# Patient Record
Sex: Female | Born: 1980 | Hispanic: No | Marital: Married | Smoking: Never smoker
Health system: Southern US, Community
[De-identification: ages and names within clinical notes are randomized; demographics above are authoritative.]

## PROBLEM LIST (undated history)

## (undated) ENCOUNTER — Inpatient Hospital Stay (HOSPITAL_COMMUNITY): Payer: Self-pay

## (undated) DIAGNOSIS — M549 Dorsalgia, unspecified: Secondary | ICD-10-CM

## (undated) DIAGNOSIS — Z789 Other specified health status: Secondary | ICD-10-CM

## (undated) HISTORY — PX: APPENDECTOMY: SHX54

---

## 2016-03-08 ENCOUNTER — Emergency Department (HOSPITAL_COMMUNITY)
Admission: EM | Admit: 2016-03-08 | Discharge: 2016-03-08 | Disposition: A | Discharge status: Home / Self Care | Payer: Self-pay | Attending: Emergency Medicine | Admitting: Emergency Medicine

## 2016-03-08 ENCOUNTER — Encounter (HOSPITAL_COMMUNITY): Payer: Self-pay

## 2016-03-08 DIAGNOSIS — G8929 Other chronic pain: Secondary | ICD-10-CM | POA: Insufficient documentation

## 2016-03-08 DIAGNOSIS — M5441 Lumbago with sciatica, right side: Secondary | ICD-10-CM | POA: Insufficient documentation

## 2016-03-08 DIAGNOSIS — M542 Cervicalgia: Secondary | ICD-10-CM | POA: Insufficient documentation

## 2016-03-08 DIAGNOSIS — M5442 Lumbago with sciatica, left side: Secondary | ICD-10-CM | POA: Insufficient documentation

## 2016-03-08 MED ORDER — CYCLOBENZAPRINE HCL 10 MG PO TABS: 10.0000 mg | ORAL_TABLET | Freq: Two times a day (BID) | ORAL | 0 refills | Status: DC | PRN

## 2016-03-08 NOTE — ED Provider Notes (Signed)
[facility]-EMERGENCY DEPT Provider Note   CSN: [id number] Arrival date & time: 03/08/16  1735  By signing my name below, I, Diona Browner and Doreatha Martin, attest that this documentation has been prepared under the direction and in the presence of Roxy Horseman, PA-C.  Electronically Signed: Diona Browner and Doreatha Martin, ED Scribe. 03/08/16. 6:29 PM.  History   Chief Complaint Chief Complaint  Patient presents with  . Back Pain  . Neck Pain    HPI Elizabeth Brennan is a 36 y.o. female who presents to the Emergency Department complaining of moderate, gradually worsening lower back pain with radiation to the right thigh for the last two days. Pt reports associated bilateral ankle pain, posterior neck pain. Pt reports no recent injuries or travel outside the country. Pt notes movement exacerbates her pain and reports she has been taking Ibuprofen with some relief of pain. Pt is ambulatory without difficulty. Of note, when pt was living in [place] she was told she had a disc problem, but did not receive treatment. Pt denies rash, dysuria and any other associated symptoms at this time.   The history is provided by the patient. A language interpreter was used (Arabic).    No past medical history on file.  There are no active problems to display for this patient.   No past surgical history on file.  OB History    No data available       Home Medications    Prior to Admission medications   Not on File    Family History No family history on file.  Social History Social History  Substance Use Topics  . Smoking status: Never Smoker  . Smokeless tobacco: Never Used  . Alcohol use No     Allergies   Patient has no allergy information on record.   Review of Systems Review of Systems  Constitutional: Positive for fever (subjective).  Genitourinary: Negative for dysuria.  Musculoskeletal: Positive for back pain, myalgias and neck pain.  Skin: Negative for rash.  Neurological:  Positive for headaches.     Physical Exam Updated Vital Signs BP 118/76 (BP Location: Left Arm)   Pulse 80   Temp 98.2 F (36.8 C) (Oral)   Resp 16   Ht 5\' 6"  (1.676 m)   Wt 170 lb (77.1 kg)   SpO2 100%   BMI 27.44 kg/m   Physical Exam Physical Exam  Constitutional: Pt appears well-developed and well-nourished. No distress.  HENT:  Head: Normocephalic and atraumatic.  Mouth/Throat: Oropharynx is clear and moist. No oropharyngeal exudate.  Eyes: Conjunctivae are normal.  Neck: Normal range of motion. Neck supple.  No meningismus Cardiovascular: Normal rate, regular rhythm and intact distal pulses.   Pulmonary/Chest: Effort normal and breath sounds normal. No respiratory distress. Pt has no wheezes.  Abdominal: Pt exhibits no distension Musculoskeletal:  Cervical and lumbar paraspinal muscles tender to palpation, no bony CTLS spine tenderness, deformity, step-off, or crepitus Lymphadenopathy: Pt has no cervical adenopathy.  Neurological: Pt is alert and oriented Speech is clear and goal oriented, follows commands Normal 5/5 strength in upper and lower extremities bilaterally including dorsiflexion and plantar flexion, strong and equal grip strength Sensation intact Great toe extension intact Moves extremities without ataxia, coordination intact  Normal gait Normal balance No Clonus Skin: Skin is warm and dry. No rash noted. Pt is not diaphoretic. No erythema.  Psychiatric: Pt has a normal mood and affect. Behavior is normal.  Nursing note and vitals reviewed.   ED Treatments /  Results  DIAGNOSTIC STUDIES: Oxygen Saturation is 100% on RA, normal by my interpretation.   COORDINATION OF CARE: 7:02 PM-Discussed next steps with pt which includes being prescribed a muscle relaxer, using ice and heat for relief, and continue use of ibuprofen. Pt verbalized understanding and is agreeable with the plan.   Labs (all labs ordered are listed, but only abnormal results are  displayed) Labs Reviewed - No data to display  EKG  EKG Interpretation None       Radiology No results found.  Procedures Procedures (including critical care time)  Medications Ordered in ED Medications - No data to display   Initial Impression / Assessment and Plan / ED Course  I have reviewed the triage vital signs and the nursing notes.  Pertinent labs & imaging results that were available during my care of the patient were reviewed by me and considered in my medical decision making (see chart for details).     Patient with back pain.  No neurological deficits and normal neuro exam.  Patient is ambulatory.  No loss of bowel or bladder control.  Doubt cauda equina.  Denies fever,  doubt epidural abscess or other lesion. Recommend back exercises, stretching, RICE, and will treat with a short course of flexeril. Continue NSAIDs.  Encouraged the patient that there could be a need for additional workup and/or imaging such as MRI, if the symptoms do not resolve. Patient advised that if the back pain does not resolve, or radiates, this could progress to more serious conditions and is encouraged to follow-up with PCP or orthopedics within 2 weeks.     Final Clinical Impressions(s) / ED Diagnoses   Final diagnoses:  Chronic midline low back pain with bilateral sciatica  Neck pain    New Prescriptions Discharge Medication List as of 03/08/2016  7:05 PM    START taking these medications   Details  cyclobenzaprine (FLEXERIL) 10 MG tablet Take 1 tablet (10 mg total) by mouth 2 (two) times daily as needed for muscle spasms., Starting Sat 03/08/2016, Print       I personally performed the services described in this documentation, which was scribed in my presence. The recorded information has been reviewed and is accurate.       Roxy HorsemanRobert Landa Mullinax, PA-C 03/08/16 2017    Laurence Spatesachel Morgan Little, MD 03/10/16 [phone]

## 2016-03-08 NOTE — ED Triage Notes (Signed)
Pt complaining of low back pain and neck pain. Pt denies any injury/trauma. Pt ambulatory at triage.

## 2016-08-11 ENCOUNTER — Encounter (HOSPITAL_COMMUNITY): Payer: Self-pay | Admitting: Emergency Medicine

## 2016-08-11 ENCOUNTER — Emergency Department (HOSPITAL_COMMUNITY): Payer: Self-pay

## 2016-08-11 DIAGNOSIS — L03011 Cellulitis of right finger: Secondary | ICD-10-CM | POA: Insufficient documentation

## 2016-08-11 NOTE — ED Triage Notes (Signed)
Triage completed w/ the use of the stratus interpretor as the pt and visitor speaks Arabic.  Pt's middle finger on her right hand is swollen and has an abscess what is yellow in color.  Pt does not remember injuring the finger but states the pain began 10 days ago. She has used Ibprofen for pain control however that is not working anymore.

## 2016-08-12 ENCOUNTER — Emergency Department (HOSPITAL_COMMUNITY)
Admission: EM | Admit: 2016-08-12 | Discharge: 2016-08-12 | Disposition: A | Discharge status: Home / Self Care | Payer: Self-pay | Attending: Emergency Medicine | Admitting: Emergency Medicine

## 2016-08-12 DIAGNOSIS — L03011 Cellulitis of right finger: Secondary | ICD-10-CM

## 2016-08-12 MED ORDER — DOXYCYCLINE HYCLATE 100 MG PO CAPS: 100.0000 mg | ORAL_CAPSULE | Freq: Two times a day (BID) | ORAL | 0 refills | Status: DC

## 2016-08-12 MED ORDER — LIDOCAINE HCL (PF) 1 % IJ SOLN
5.0000 mL | Freq: Once | INTRAMUSCULAR | Status: AC
  Administered 2016-08-12: 5 mL
  Filled 2016-08-12: qty 5

## 2016-08-12 NOTE — ED Notes (Signed)
Patient is A&Ox4.  No signs of distress noted.  Please see providers complete history and physical exam.  

## 2016-08-12 NOTE — ED Provider Notes (Signed)
[facility] Provider Note   CSN: [id number] Arrival date & time: 08/11/16  2123     History   Chief Complaint Chief Complaint  Patient presents with  . Hand Pain  . Abscess    HPI Elizabeth Brennan is a 36 y.o. female.  Patient presents to the ED with a chief complaint of finger infection.  Patient states that she noticed the infection about 10 days ago.  She denies any trauma or other injury.  Denies biting her nails.  Denies any fever or chills.  She has tried taking ibuprofen with no relief.  The symptoms are worsened with palpation and movement.   The history is provided by the patient. The history is limited by a language barrier. A language interpreter was used.    History reviewed. No pertinent past medical history.  There are no active problems to display for this patient.   History reviewed. No pertinent surgical history.  OB History    No data available       Home Medications    Prior to Admission medications   Medication Sig Start Date End Date Taking? Authorizing Provider  cyclobenzaprine (FLEXERIL) 10 MG tablet Take 1 tablet (10 mg total) by mouth 2 (two) times daily as needed for muscle spasms. 03/08/16   Roxy HorsemanBrowning, Euline Kimbler, PA-C    Family History No family history on file.  Social History Social History  Substance Use Topics  . Smoking status: Never Smoker  . Smokeless tobacco: Never Used  . Alcohol use No     Allergies   Patient has no known allergies.   Review of Systems Review of Systems  All other systems reviewed and are negative.    Physical Exam Updated Vital Signs BP 105/77 (BP Location: Right Arm)   Pulse 73   Temp 98.2 F (36.8 C) (Oral)   Resp 18   SpO2 99%   Physical Exam  Constitutional: She is oriented to person, place, and time. She appears well-developed and well-nourished.  HENT:  Head: Normocephalic and atraumatic.  Eyes: Conjunctivae and EOM are normal.  Neck: Normal range of motion.  Cardiovascular:  Normal rate.   Pulmonary/Chest: Effort normal.  Abdominal: She exhibits no distension.  Musculoskeletal: Normal range of motion.  Neurological: She is alert and oriented to person, place, and time.  Skin: Skin is dry.  Large dorsal paronychia extending from the cuticle to the DIP of the right middle finger, no palmar involvement, no sign of felon  Psychiatric: She has a normal mood and affect. Her behavior is normal. Judgment and thought content normal.  Nursing note and vitals reviewed.    ED Treatments / Results  Labs (all labs ordered are listed, but only abnormal results are displayed) Labs Reviewed - No data to display  EKG  EKG Interpretation None       Radiology Dg Finger Middle Right  Result Date: 08/11/2016 CLINICAL DATA:  36 year old female with infection and swelling of the distal aspect of the middle finger. EXAM: RIGHT MIDDLE FINGER 2+V COMPARISON:  None. FINDINGS: There is no acute fracture or dislocation. The bones are well mineralized. No arthritic changes. There is no periosteal elevation or erosive changes. There is diffuse soft tissue swelling of the third digit primarily involving the distal aspect of the finger. No radiopaque foreign object or soft tissue gas. IMPRESSION: 1. No acute osseous pathology. 2. Diffuse soft tissue swelling of the third digit. Electronically Signed   By: Elgie CollardArash  Radparvar M.D.   On: 08/11/2016 22:19  Procedures Procedures (including critical care time) INCISION AND DRAINAGE Performed by: Roxy HorsemanBROWNING, Layla Kesling Consent: Verbal consent obtained. Risks and benefits: risks, benefits and alternatives were discussed Type: abscess  Body area: Right middle finger  Anesthesia: local infiltration  Incision was made with a scalpel.  Local anesthetic: lidocaine 1% without epinephrine  Anesthetic total: 3 ml  Complexity: complex Blunt dissection to break up loculations  Drainage: purulent  Drainage amount: copious  Packing material:  1/4 in iodoform gauze  Patient tolerance: Patient tolerated the procedure well with no immediate complications.    Medications Ordered in ED Medications  lidocaine (PF) (XYLOCAINE) 1 % injection 5 mL (not administered)     Initial Impression / Assessment and Plan / ED Course  I have reviewed the triage vital signs and the nursing notes.  Pertinent labs & imaging results that were available during my care of the patient were reviewed by me and considered in my medical decision making (see chart for details).     Patient with skin abscess amenable to incision and drainage.  Abscess was not large enough to warrant packing or drain,  wound recheck in 2 days. Encouraged home warm soaks and flushing.  Mild signs of cellulitis is surrounding skin.  Will d/c to home.     Final Clinical Impressions(s) / ED Diagnoses   Final diagnoses:  None    New Prescriptions New Prescriptions   DOXYCYCLINE (VIBRAMYCIN) 100 MG CAPSULE    Take 1 capsule (100 mg total) by mouth 2 (two) times daily.     Roxy HorsemanBrowning, Onofrio Klemp, PA-C 08/12/16 0330    Palumbo, April, MD 08/12/16 [phone]

## 2016-11-15 ENCOUNTER — Emergency Department (HOSPITAL_COMMUNITY): Payer: Self-pay

## 2016-11-15 ENCOUNTER — Emergency Department (HOSPITAL_COMMUNITY)
Admission: EM | Admit: 2016-11-15 | Discharge: 2016-11-15 | Discharge status: Against Medical Advice | Payer: Self-pay | Attending: Emergency Medicine | Admitting: Emergency Medicine

## 2016-11-15 ENCOUNTER — Encounter (HOSPITAL_COMMUNITY): Payer: Self-pay

## 2016-11-15 DIAGNOSIS — R509 Fever, unspecified: Secondary | ICD-10-CM | POA: Insufficient documentation

## 2016-11-15 DIAGNOSIS — R059 Cough, unspecified: Secondary | ICD-10-CM

## 2016-11-15 DIAGNOSIS — R300 Dysuria: Secondary | ICD-10-CM | POA: Insufficient documentation

## 2016-11-15 DIAGNOSIS — M542 Cervicalgia: Secondary | ICD-10-CM | POA: Insufficient documentation

## 2016-11-15 DIAGNOSIS — M5442 Lumbago with sciatica, left side: Secondary | ICD-10-CM | POA: Insufficient documentation

## 2016-11-15 DIAGNOSIS — Z79899 Other long term (current) drug therapy: Secondary | ICD-10-CM | POA: Insufficient documentation

## 2016-11-15 DIAGNOSIS — J189 Pneumonia, unspecified organism: Secondary | ICD-10-CM

## 2016-11-15 DIAGNOSIS — J181 Lobar pneumonia, unspecified organism: Secondary | ICD-10-CM | POA: Insufficient documentation

## 2016-11-15 DIAGNOSIS — R05 Cough: Secondary | ICD-10-CM

## 2016-11-15 DIAGNOSIS — R51 Headache: Secondary | ICD-10-CM | POA: Insufficient documentation

## 2016-11-15 HISTORY — DX: Dorsalgia, unspecified: M54.9

## 2016-11-15 LAB — COMPREHENSIVE METABOLIC PANEL
ALBUMIN: 3.7 g/dL (ref 3.5–5.0)
ALT: 11 U/L — ABNORMAL LOW (ref 14–54)
AST: 23 U/L (ref 15–41)
Alkaline Phosphatase: 63 U/L (ref 38–126)
Anion gap: 8 (ref 5–15)
BUN: 9 mg/dL (ref 6–20)
CHLORIDE: 105 mmol/L (ref 101–111)
CO2: 20 mmol/L — AB (ref 22–32)
Calcium: 8.8 mg/dL — ABNORMAL LOW (ref 8.9–10.3)
Creatinine, Ser: 0.68 mg/dL (ref 0.44–1.00)
GFR calc Af Amer: >60 mL/min (ref >60)
GFR calc non Af Amer: >60 mL/min (ref >60)
GLUCOSE: 95 mg/dL (ref 65–99)
POTASSIUM: 3.4 mmol/L — AB (ref 3.5–5.1)
Sodium: 133 mmol/L — ABNORMAL LOW (ref 135–145)
Total Bilirubin: 0.4 mg/dL (ref 0.3–1.2)
Total Protein: 8 g/dL (ref 6.5–8.1)

## 2016-11-15 LAB — URINALYSIS, ROUTINE W REFLEX MICROSCOPIC
BILIRUBIN URINE: NEGATIVE
Glucose, UA: NEGATIVE mg/dL
Ketones, ur: 20 mg/dL — AB
Nitrite: NEGATIVE
PROTEIN: 30 mg/dL — AB
SPECIFIC GRAVITY, URINE: 1.031 — AB (ref 1.005–1.030)
pH: 5 (ref 5.0–8.0)

## 2016-11-15 LAB — CBC
HEMATOCRIT: 39.2 % (ref 36.0–46.0)
Hemoglobin: 13.3 g/dL (ref 12.0–15.0)
MCH: 28.2 pg (ref 26.0–34.0)
MCHC: 33.9 g/dL (ref 30.0–36.0)
MCV: 83.1 fL (ref 78.0–100.0)
PLATELETS: 172 10*3/uL (ref 150–400)
RBC: 4.72 MIL/uL (ref 3.87–5.11)
RDW: 13.7 % (ref 11.5–15.5)
WBC: 4.8 10*3/uL (ref 4.0–10.5)

## 2016-11-15 LAB — I-STAT BETA HCG BLOOD, ED (MC, WL, AP ONLY): I-stat hCG, quantitative: <5 m[IU]/mL (ref <5)

## 2016-11-15 MED ORDER — LEVOFLOXACIN 750 MG PO TABS: 750.0000 mg | ORAL_TABLET | Freq: Every day | ORAL | 0 refills | Status: AC

## 2016-11-15 MED ORDER — CYCLOBENZAPRINE HCL 10 MG PO TABS: 10.0000 mg | ORAL_TABLET | Freq: Two times a day (BID) | ORAL | 0 refills | Status: DC | PRN

## 2016-11-15 MED ORDER — IBUPROFEN 600 MG PO TABS: 600.0000 mg | ORAL_TABLET | Freq: Four times a day (QID) | ORAL | 0 refills | Status: DC | PRN

## 2016-11-15 MED ORDER — KETOROLAC TROMETHAMINE 30 MG/ML IJ SOLN
30.0000 mg | Freq: Once | INTRAMUSCULAR | Status: AC
  Administered 2016-11-15: 30 mg via INTRAVENOUS
  Filled 2016-11-15: qty 1

## 2016-11-15 MED ORDER — LEVOFLOXACIN 750 MG PO TABS
750.0000 mg | ORAL_TABLET | Freq: Once | ORAL | Status: AC
  Administered 2016-11-15: 750 mg via ORAL
  Filled 2016-11-15: qty 1

## 2016-11-15 NOTE — ED Notes (Signed)
No answer for triage X3 

## 2016-11-15 NOTE — ED Notes (Signed)
Lab reports there was not enough urine sent down, offered urine cup to pt again, pt unable to void at this time.

## 2016-11-15 NOTE — Discharge Instructions (Signed)
Take full course of antibiotics as prescribed.  Follow-up with the [facility] next week to ensure you are improving.  If you develop worsening shortness of breath, chest pain, or persistent fevers and chills despite these antibiotics please return to the emergency department for reevaluation.  Health department should follow-up with you regarding tuberculosis screening for your family, please continue to wear a mask until you hear from the health department.  Your back pain can be treated with ibuprofen and Flexeril.  If the symptoms are not improving with these medications and rest please follow-up with primary doctor at [facility] for further evaluation.  If you develop difficulty walking, lower extremity weakness or numbness, or unable to control your bowel or bladder please return to the emergency department for evaluation.

## 2016-11-15 NOTE — ED Triage Notes (Signed)
Pt arrived with c/o cough, back pain, and headache. Pt rpeorts her son who is with her has been treated for TB since July. Pt reports nonproductive cough and states she has felt like she has had a fever X3 days. Afebrile upon arrival.

## 2016-11-15 NOTE — ED Provider Notes (Signed)
[facility] EMERGENCY DEPARTMENT Provider Note   CSN: [id number] Arrival date & time: 11/15/16  1126     History   Chief Complaint Chief Complaint  Patient presents with  . Cough    HPI  Elizabeth Brennan is a 36 y.o. Female with a history of low back pain, presents with multiple complaints.  Patient reports nonproductive cough, which started yesterday, No hemoptysis.  Patient reports her 24-year-old sonwas diagnosed with TB in July and is currently receiving treatment, but she and the rest of the people in her household are not receiving any treatment for tuberculosis or exposure.  Reports feeling feverish with some chills, did not take her temperature at home.  Patient denies any associated rhinorrhea, congestion, ear pain, or shortness of breath.  Patient reports her husband has recently had a cough, she thinks she is been diagnosed with the flu.    Also here complaining of low back pain, more so on the left side, she reports she has had this back pain in the past.  She reports back pain is constant soreness, with pain that radiates down the left leg.  Patient reports she has had similar symptoms in the past and taken medication for it, but is unable to remember what medication. Denies weakness, numbness, loss of bowel/bladder function or saddle anesthesia.  She also reports some left-sided neck pain, this does not radiate anywhere, patient reports it just feels sore especially when she moves her neck.  She reports associated headache, that is generalized and feels like a tight band around her head, she has had similar headaches in the past that typically resolve on their own.  She denies any nausea vomiting or photophobia, no vision changes, no weakness, numbness or tingling of extremities.  Pt is arabic speaking, stratus interpreting services used to obtain history. Pt is a poor historian and is unable to provide much detail about her symptoms.      Past Medical History:    Diagnosis Date  . Back pain     There are no active problems to display for this patient.   Past Surgical History:  Procedure Laterality Date  . APPENDECTOMY      OB History    No data available       Home Medications    Prior to Admission medications   Medication Sig Start Date End Date Taking? Authorizing Provider  cyclobenzaprine (FLEXERIL) 10 MG tablet Take 1 tablet (10 mg total) by mouth 2 (two) times daily as needed for muscle spasms. 03/08/16   Roxy Horseman, PA-C  doxycycline (VIBRAMYCIN) 100 MG capsule Take 1 capsule (100 mg total) by mouth 2 (two) times daily. 08/12/16   Roxy Horseman, PA-C    Family History No family history on file.  Social History Social History  Substance Use Topics  . Smoking status: Never Smoker  . Smokeless tobacco: Never Used  . Alcohol use No     Allergies   Patient has no known allergies.   Review of Systems Review of Systems  Constitutional: Positive for chills and fever.  HENT: Negative for congestion, ear pain, rhinorrhea and sore throat.   Eyes: Negative for photophobia, pain and visual disturbance.  Respiratory: Positive for cough. Negative for chest tightness and shortness of breath.   Cardiovascular: Negative for chest pain, palpitations and leg swelling.  Gastrointestinal: Negative for abdominal pain, constipation, diarrhea, nausea and vomiting.  Genitourinary: Positive for dysuria. Negative for flank pain, frequency and hematuria.  Musculoskeletal: Positive for back  pain and neck pain. Negative for neck stiffness.  Skin: Negative for color change, pallor and rash.  Neurological: Positive for headaches. Negative for dizziness, syncope, weakness, light-headedness and numbness.     Physical Exam Updated Vital Signs BP 116/85   Pulse 91   Temp 98.6 F (37 C) (Oral)   Resp 16   Ht 5\' 9"  (1.753 m)   Wt 72.6 kg (160 lb)   LMP 10/18/2016   SpO2 97%   BMI 23.63 kg/m   Physical Exam  Constitutional: She  appears well-developed and well-nourished. No distress.  HENT:  Head: Normocephalic and atraumatic.  Eyes: Pupils are equal, round, and reactive to light. EOM are normal. Right eye exhibits no discharge. Left eye exhibits no discharge.  Neck: Normal range of motion. Neck supple.  No midline C-spine tenderness, mild paraspinal tenderness on the left side  Cardiovascular: Normal rate, regular rhythm, normal heart sounds and intact distal pulses.   Pulmonary/Chest: Effort normal and breath sounds normal. No respiratory distress. She has no wheezes. She has no rales.  Abdominal: Soft. Bowel sounds are normal. She exhibits no distension and no mass. There is no tenderness. There is no guarding.  Musculoskeletal: She exhibits no edema or deformity.  L-spine NTTP at midline,tenderness along left lower back, no ecchymosis or rash noted. L-spine ROM minimally limited by pain. Normal ROM of LE. Bilateral distal pulses 2+  Neurological: She is alert. Coordination normal.  Speech is clear, able to follow commands CN III-XII intact Normal strength in upper and lower extremities bilaterally including dorsiflexion and plantar flexion, strong and equal grip strength Sensation normal to light and sharp touch Moves extremities without ataxia, coordination intact Normal finger to nose and rapid alternating movements No pronator drift  Skin: Skin is warm and dry. Capillary refill takes less than 2 seconds. She is not diaphoretic.  Psychiatric: She has a normal mood and affect. Her behavior is normal.  Nursing note and vitals reviewed.    ED Treatments / Results  Labs (all labs ordered are listed, but only abnormal results are displayed) Labs Reviewed  URINALYSIS, ROUTINE W REFLEX MICROSCOPIC - Abnormal; Notable for the following:       Result Value   Color, Urine AMBER (*)    APPearance CLOUDY (*)    Specific Gravity, Urine 1.031 (*)    Hgb urine dipstick LARGE (*)    Ketones, ur 20 (*)    Protein,  ur 30 (*)    Leukocytes, UA TRACE (*)    Bacteria, UA MANY (*)    Squamous Epithelial / LPF 6-30 (*)    All other components within normal limits  COMPREHENSIVE METABOLIC PANEL - Abnormal; Notable for the following:    Sodium 133 (*)    Potassium 3.4 (*)    CO2 20 (*)    Calcium 8.8 (*)    ALT 11 (*)    All other components within normal limits  CBC  HIV ANTIBODY (ROUTINE TESTING)  QUANTIFERON-TB GOLD PLUS  I-STAT BETA HCG BLOOD, ED ([facility], [facility], [facility] ONLY)    EKG  EKG Interpretation None       Radiology Dg Chest Port 1 View  Result Date: 11/15/2016 CLINICAL DATA:  Cough.  Clinical concern for tuberculosis. EXAM: PORTABLE CHEST 1 VIEW COMPARISON:  None. FINDINGS: Normal sized heart. Minimal patchy and linear density at the right lung base. Otherwise, clear lungs. Normal appearing bones. IMPRESSION: Minimal patchy and linear density at the right lung base. This has an appearance suspicious  for a combination of pneumonia and linear atelectasis. No findings concerning for tuberculosis infection. Electronically Signed   By: Beckie Salts M.D.   On: 11/15/2016 13:21    Procedures Procedures (including critical care time)  Medications Ordered in ED Medications  ketorolac (TORADOL) 30 MG/ML injection 30 mg (30 mg Intravenous Given 11/15/16 1716)  levofloxacin (LEVAQUIN) tablet 750 mg (750 mg Oral Given 11/15/16 1716)     Initial Impression / Assessment and Plan / ED Course  I have reviewed the triage vital signs and the nursing notes.  Pertinent labs & imaging results that were available during my care of the patient were reviewed by me and considered in my medical decision making (see chart for details).  Presents with 2 days of cough and subjective fever, in the setting of potential TB exposure from  her 37-year-old son who is currently on treatment.  Patient also complaining of low back pain, with some neck pain and headache.  Back pain presentation suggestive of left sided sciatica.  Denies weakness, numbness, loss of bowel/bladder function or saddle anesthesia, no concern for cauda equina. HA presentation is like pts typical HA and non concerning for [facility], ICH, Meningitis, or temporal arteritis. Pt is afebrile with no focal neuro deficits, nuchal rigidity, or change in vision.   Chest x-ray shows right lower lobe infiltrate, radiologist reports CXR non-concerning for TB. Labs without leukocytosis, mild hypokalemia encouraged pt to eat food high in potassium. Kidney and liver function WNL. UA not a clean catch, unlikely infection, but ABX for pneumonia will cover regardless. Pt given Toradol for back pain and received first dose of abx in ED.  Consulted infectious disease regarding chest x-ray, spoke with Dr. Drue Second who recommends treating for CAP, as well as collecting QuantiFERON and HIV labs on the patient.  Infectious disease MD says they will reach out to the health department and have them follow-up with the family.  Pt will be discharged with Levaquin for CAP as well as ibuprofen and flexeril for back pain. Pt to follow up with [facility] next week. Health department to follow up with pt regarding further TB screening, pt to continue to wear mask until then. Return precautions discussed.  Patient discussed with Dr. Rush Landmark, who agrees with plan.   Final Clinical Impressions(s) / ED Diagnoses   Final diagnoses:  Community acquired pneumonia of right lower lobe of lung (HCC)  Left-sided low back pain with left-sided sciatica, unspecified chronicity    New Prescriptions Discharge Medication List as of 11/15/2016  4:56 PM    START taking these medications   Details  !! cyclobenzaprine (FLEXERIL) 10 MG tablet Take 1 tablet (10 mg total) by mouth 2 (two) times daily as needed for muscle spasms., Starting Sat 11/15/2016, Print    ibuprofen (ADVIL,MOTRIN) 600 MG tablet Take 1 tablet (600 mg total) by mouth every 6 (six) hours as needed.,  Starting Sat 11/15/2016, Print    levofloxacin (LEVAQUIN) 750 MG tablet Take 1 tablet (750 mg total) by mouth daily. X 5 days, Starting Sat 11/15/2016, Until Thu 11/20/2016, Print     !! - Potential duplicate medications found. Please discuss with provider.       Dartha Lodge, PA-C 11/15/16 2021    Tegeler, Canary Brim, MD 11/16/16 [phone]

## 2016-11-15 NOTE — ED Notes (Signed)
No answer for triage.

## 2016-11-16 LAB — HIV ANTIBODY (ROUTINE TESTING W REFLEX): HIV Screen 4th Generation wRfx: NONREACTIVE

## 2016-11-18 LAB — QUANTIFERON-TB GOLD PLUS

## 2017-07-06 ENCOUNTER — Other Ambulatory Visit: Payer: Self-pay

## 2017-07-06 ENCOUNTER — Encounter (HOSPITAL_COMMUNITY): Payer: Self-pay | Admitting: *Deleted

## 2017-07-06 ENCOUNTER — Inpatient Hospital Stay (HOSPITAL_COMMUNITY)
Admission: AD | Admit: 2017-07-06 | Discharge: 2017-07-06 | Disposition: A | Discharge status: Home / Self Care | Payer: Self-pay | Source: Ambulatory Visit | Attending: Obstetrics and Gynecology | Admitting: Obstetrics and Gynecology

## 2017-07-06 DIAGNOSIS — R109 Unspecified abdominal pain: Secondary | ICD-10-CM | POA: Insufficient documentation

## 2017-07-06 LAB — URINALYSIS, ROUTINE W REFLEX MICROSCOPIC
Bilirubin Urine: NEGATIVE
GLUCOSE, UA: NEGATIVE mg/dL
Ketones, ur: NEGATIVE mg/dL
Nitrite: NEGATIVE
PH: 5 (ref 5.0–8.0)
Protein, ur: NEGATIVE mg/dL
Specific Gravity, Urine: 1.026 (ref 1.005–1.030)

## 2017-07-06 NOTE — MAU Note (Signed)
Abd pain.  ? Allergy, rash all over body, show pin dots spots on arms, on spot is ~1cm. Present for 3 wks.  Itches.  Today she is tired, has vomited many many times.  [facility]

## 2017-07-23 LAB — OB RESULTS CONSOLE ANTIBODY SCREEN: ANTIBODY SCREEN: NEGATIVE

## 2017-07-23 LAB — OB RESULTS CONSOLE GC/CHLAMYDIA
CHLAMYDIA, DNA PROBE: NEGATIVE
Gonorrhea: NEGATIVE

## 2017-07-23 LAB — OB RESULTS CONSOLE ABO/RH: RH Type: POSITIVE

## 2017-07-23 LAB — OB RESULTS CONSOLE RPR: RPR: NONREACTIVE

## 2017-07-23 LAB — OB RESULTS CONSOLE HIV ANTIBODY (ROUTINE TESTING): HIV: NONREACTIVE

## 2017-07-23 LAB — OB RESULTS CONSOLE RUBELLA ANTIBODY, IGM: Rubella: IMMUNE

## 2017-07-23 LAB — OB RESULTS CONSOLE HEPATITIS B SURFACE ANTIGEN: HEP B S AG: NEGATIVE

## 2017-07-29 ENCOUNTER — Encounter (HOSPITAL_COMMUNITY): Payer: Self-pay

## 2017-07-31 ENCOUNTER — Ambulatory Visit (HOSPITAL_COMMUNITY)
Admission: RE | Admit: 2017-07-31 | Discharge: 2017-07-31 | Disposition: A | Discharge status: Home / Self Care | Payer: Self-pay | Source: Ambulatory Visit | Attending: Nurse Practitioner | Admitting: Nurse Practitioner

## 2017-07-31 ENCOUNTER — Encounter (HOSPITAL_COMMUNITY): Payer: Self-pay

## 2017-07-31 HISTORY — DX: Other specified health status: Z78.9

## 2018-01-08 LAB — OB RESULTS CONSOLE GBS: GBS: NEGATIVE

## 2018-02-01 ENCOUNTER — Other Ambulatory Visit: Payer: Self-pay | Admitting: Family Medicine

## 2018-02-01 ENCOUNTER — Encounter (HOSPITAL_COMMUNITY): Payer: Self-pay | Admitting: *Deleted

## 2018-02-01 ENCOUNTER — Telehealth (HOSPITAL_COMMUNITY): Payer: Self-pay | Admitting: *Deleted

## 2018-02-01 NOTE — Telephone Encounter (Signed)
[phone] interpreter number  Preadmission screen

## 2018-02-02 ENCOUNTER — Inpatient Hospital Stay (HOSPITAL_COMMUNITY): Payer: Medicaid Other | Admitting: Anesthesiology

## 2018-02-02 ENCOUNTER — Encounter (HOSPITAL_COMMUNITY)
Admission: AD | Disposition: A | Discharge status: Home / Self Care | Payer: Self-pay | Source: Home / Self Care | Attending: Family Medicine

## 2018-02-02 ENCOUNTER — Inpatient Hospital Stay (HOSPITAL_COMMUNITY)
Admission: AD | Admit: 2018-02-02 | Discharge: 2018-02-05 | DRG: 787 | Disposition: A | Discharge status: Home / Self Care | Payer: Medicaid Other | Attending: Family Medicine | Admitting: Family Medicine

## 2018-02-02 ENCOUNTER — Other Ambulatory Visit: Payer: Self-pay

## 2018-02-02 ENCOUNTER — Encounter (HOSPITAL_COMMUNITY): Payer: Self-pay | Admitting: *Deleted

## 2018-02-02 DIAGNOSIS — Z98891 History of uterine scar from previous surgery: Secondary | ICD-10-CM

## 2018-02-02 DIAGNOSIS — O324XX0 Maternal care for high head at term, not applicable or unspecified: Secondary | ICD-10-CM | POA: Diagnosis present

## 2018-02-02 DIAGNOSIS — Z3A40 40 weeks gestation of pregnancy: Secondary | ICD-10-CM

## 2018-02-02 DIAGNOSIS — D62 Acute posthemorrhagic anemia: Secondary | ICD-10-CM | POA: Diagnosis not present

## 2018-02-02 DIAGNOSIS — O480 Post-term pregnancy: Secondary | ICD-10-CM | POA: Diagnosis present

## 2018-02-02 DIAGNOSIS — O9089 Other complications of the puerperium, not elsewhere classified: Secondary | ICD-10-CM | POA: Diagnosis not present

## 2018-02-02 DIAGNOSIS — O9081 Anemia of the puerperium: Secondary | ICD-10-CM | POA: Diagnosis not present

## 2018-02-02 DIAGNOSIS — M5126 Other intervertebral disc displacement, lumbar region: Secondary | ICD-10-CM | POA: Diagnosis present

## 2018-02-02 DIAGNOSIS — R531 Weakness: Secondary | ICD-10-CM | POA: Diagnosis not present

## 2018-02-02 DIAGNOSIS — R200 Anesthesia of skin: Secondary | ICD-10-CM | POA: Diagnosis not present

## 2018-02-02 LAB — CBC
HCT: 35.7 % — ABNORMAL LOW (ref 36.0–46.0)
Hemoglobin: 11.3 g/dL — ABNORMAL LOW (ref 12.0–15.0)
MCH: 25.2 pg — ABNORMAL LOW (ref 26.0–34.0)
MCHC: 31.7 g/dL (ref 30.0–36.0)
MCV: 79.7 fL — ABNORMAL LOW (ref 80.0–100.0)
NRBC: 0 % (ref 0.0–0.2)
Platelets: 128 10*3/uL — ABNORMAL LOW (ref 150–400)
RBC: 4.48 MIL/uL (ref 3.87–5.11)
RDW: 17 % — ABNORMAL HIGH (ref 11.5–15.5)
WBC: 7.3 10*3/uL (ref 4.0–10.5)

## 2018-02-02 LAB — ABO/RH: ABO/RH(D): B POS

## 2018-02-02 SURGERY — Surgical Case
Anesthesia: Epidural

## 2018-02-02 MED ORDER — TERBUTALINE SULFATE 1 MG/ML IJ SOLN: 0.2500 mg | Freq: Once | INTRAMUSCULAR | Status: DC | PRN

## 2018-02-02 MED ORDER — LACTATED RINGERS IV SOLN
INTRAVENOUS | Status: DC | PRN
  Administered 2018-02-02 – 2018-02-03 (×3): via INTRAVENOUS

## 2018-02-02 MED ORDER — SODIUM CHLORIDE 0.9 % IV SOLN
500.0000 mg | Freq: Once | INTRAVENOUS | Status: AC
  Administered 2018-02-03: 500 mg via INTRAVENOUS
  Filled 2018-02-02: qty 500

## 2018-02-02 MED ORDER — LACTATED RINGERS IV SOLN: 500.0000 mL | Freq: Once | INTRAVENOUS | Status: DC

## 2018-02-02 MED ORDER — FENTANYL 2.5 MCG/ML BUPIVACAINE 1/10 % EPIDURAL INFUSION (WH - ANES)
14.0000 mL/h | INTRAMUSCULAR | Status: DC | PRN
  Administered 2018-02-02 (×2): 14 mL/h via EPIDURAL
  Filled 2018-02-02 (×3): qty 100

## 2018-02-02 MED ORDER — LIDOCAINE HCL (PF) 1 % IJ SOLN
INTRAMUSCULAR | Status: DC | PRN
  Administered 2018-02-02 (×2): 4 mL via EPIDURAL

## 2018-02-02 MED ORDER — SODIUM BICARBONATE 8.4 % IV SOLN
INTRAVENOUS | Status: DC | PRN
  Administered 2018-02-02: 5 mL via EPIDURAL
  Administered 2018-02-02: 3 mL via EPIDURAL
  Administered 2018-02-02: 10 mL via EPIDURAL
  Administered 2018-02-03: 2 mL via EPIDURAL
  Administered 2018-02-03 (×2): 3 mL via EPIDURAL

## 2018-02-02 MED ORDER — ONDANSETRON HCL 4 MG/2ML IJ SOLN
INTRAMUSCULAR | Status: AC
  Filled 2018-02-02: qty 2

## 2018-02-02 MED ORDER — SODIUM BICARBONATE 8.4 % IV SOLN
INTRAVENOUS | Status: AC
  Filled 2018-02-02: qty 50

## 2018-02-02 MED ORDER — CEFAZOLIN SODIUM-DEXTROSE 2-4 GM/100ML-% IV SOLN
2.0000 g | Freq: Once | INTRAVENOUS | Status: AC
  Administered 2018-02-02: 2 g via INTRAVENOUS

## 2018-02-02 MED ORDER — OXYTOCIN 40 UNITS IN NORMAL SALINE INFUSION - SIMPLE MED
1.0000 m[IU]/min | INTRAVENOUS | Status: DC
  Administered 2018-02-02: 2 m[IU]/min via INTRAVENOUS
  Filled 2018-02-02: qty 1000

## 2018-02-02 MED ORDER — ONDANSETRON HCL 4 MG/2ML IJ SOLN
4.0000 mg | Freq: Four times a day (QID) | INTRAMUSCULAR | Status: DC | PRN
  Administered 2018-02-03: 4 mg via INTRAVENOUS

## 2018-02-02 MED ORDER — MORPHINE SULFATE (PF) 0.5 MG/ML IJ SOLN
INTRAMUSCULAR | Status: AC
  Filled 2018-02-02: qty 10

## 2018-02-02 MED ORDER — PHENYLEPHRINE 40 MCG/ML (10ML) SYRINGE FOR IV PUSH (FOR BLOOD PRESSURE SUPPORT)
80.0000 ug | PREFILLED_SYRINGE | INTRAVENOUS | Status: DC | PRN
  Filled 2018-02-02: qty 10

## 2018-02-02 MED ORDER — LIDOCAINE-EPINEPHRINE (PF) 2 %-1:200000 IJ SOLN
INTRAMUSCULAR | Status: AC
  Filled 2018-02-02: qty 20

## 2018-02-02 MED ORDER — LIDOCAINE HCL (PF) 1 % IJ SOLN
30.0000 mL | INTRAMUSCULAR | Status: DC | PRN
  Filled 2018-02-02: qty 30

## 2018-02-02 MED ORDER — OXYTOCIN 40 UNITS IN NORMAL SALINE INFUSION - SIMPLE MED
2.5000 [IU]/h | INTRAVENOUS | Status: DC
  Filled 2018-02-02: qty 1000

## 2018-02-02 MED ORDER — LACTATED RINGERS IV SOLN
INTRAVENOUS | Status: DC
  Administered 2018-02-02 (×3): via INTRAVENOUS

## 2018-02-02 MED ORDER — OXYTOCIN BOLUS FROM INFUSION: 500.0000 mL | Freq: Once | INTRAVENOUS | Status: DC

## 2018-02-02 MED ORDER — LACTATED RINGERS IV SOLN
500.0000 mL | INTRAVENOUS | Status: DC | PRN
  Administered 2018-02-02: 500 mL via INTRAVENOUS
  Administered 2018-02-02: 1000 mL via INTRAVENOUS

## 2018-02-02 MED ORDER — DIPHENHYDRAMINE HCL 50 MG/ML IJ SOLN: 12.5000 mg | INTRAMUSCULAR | Status: DC | PRN

## 2018-02-02 MED ORDER — FENTANYL CITRATE (PF) 100 MCG/2ML IJ SOLN: 100.0000 ug | INTRAMUSCULAR | Status: DC | PRN

## 2018-02-02 MED ORDER — SOD CITRATE-CITRIC ACID 500-334 MG/5ML PO SOLN
30.0000 mL | ORAL | Status: DC | PRN
  Filled 2018-02-02: qty 15

## 2018-02-02 MED ORDER — EPHEDRINE 5 MG/ML INJ: 10.0000 mg | INTRAVENOUS | Status: DC | PRN

## 2018-02-02 MED ORDER — FLEET ENEMA 7-19 GM/118ML RE ENEM: 1.0000 | ENEMA | RECTAL | Status: DC | PRN

## 2018-02-02 MED ORDER — MISOPROSTOL 25 MCG QUARTER TABLET: 25.0000 ug | ORAL_TABLET | ORAL | Status: DC | PRN

## 2018-02-02 MED ORDER — LACTATED RINGERS AMNIOINFUSION
INTRAVENOUS | Status: DC
  Administered 2018-02-02: 21:00:00 via INTRAUTERINE

## 2018-02-02 MED ORDER — PHENYLEPHRINE 40 MCG/ML (10ML) SYRINGE FOR IV PUSH (FOR BLOOD PRESSURE SUPPORT): 80.0000 ug | PREFILLED_SYRINGE | INTRAVENOUS | Status: DC | PRN

## 2018-02-02 MED ORDER — OXYTOCIN 10 UNIT/ML IJ SOLN
INTRAMUSCULAR | Status: AC
  Filled 2018-02-02: qty 4

## 2018-02-02 SURGICAL SUPPLY — 35 items
BENZOIN TINCTURE PRP APPL 2/3 (GAUZE/BANDAGES/DRESSINGS) ×3 IMPLANT
CHLORAPREP W/TINT 26ML (MISCELLANEOUS) ×3 IMPLANT
CLAMP CORD UMBIL (MISCELLANEOUS) IMPLANT
CLOSURE WOUND 1/2 X4 (GAUZE/BANDAGES/DRESSINGS) ×1
CLOTH BEACON ORANGE TIMEOUT ST (SAFETY) ×3 IMPLANT
DRSG OPSITE POSTOP 4X10 (GAUZE/BANDAGES/DRESSINGS) ×3 IMPLANT
ELECT REM PT RETURN 9FT ADLT (ELECTROSURGICAL) ×3
ELECTRODE REM PT RTRN 9FT ADLT (ELECTROSURGICAL) ×1 IMPLANT
EXTRACTOR VACUUM M CUP 4 TUBE (SUCTIONS) IMPLANT
EXTRACTOR VACUUM M CUP 4' TUBE (SUCTIONS)
GLOVE BIOGEL PI IND STRL 7.0 (GLOVE) ×2 IMPLANT
GLOVE BIOGEL PI IND STRL 7.5 (GLOVE) ×2 IMPLANT
GLOVE BIOGEL PI INDICATOR 7.0 (GLOVE) ×4
GLOVE BIOGEL PI INDICATOR 7.5 (GLOVE) ×4
GLOVE ECLIPSE 7.5 STRL STRAW (GLOVE) ×3 IMPLANT
GOWN STRL REUS W/TWL LRG LVL3 (GOWN DISPOSABLE) ×9 IMPLANT
KIT ABG SYR 3ML LUER SLIP (SYRINGE) IMPLANT
NEEDLE HYPO 25X5/8 SAFETYGLIDE (NEEDLE) IMPLANT
NS IRRIG 1000ML POUR BTL (IV SOLUTION) ×3 IMPLANT
PACK C SECTION WH (CUSTOM PROCEDURE TRAY) ×3 IMPLANT
PAD OB MATERNITY 4.3X12.25 (PERSONAL CARE ITEMS) ×3 IMPLANT
PENCIL SMOKE EVAC W/HOLSTER (ELECTROSURGICAL) ×3 IMPLANT
RTRCTR C-SECT PINK 25CM LRG (MISCELLANEOUS) ×3 IMPLANT
SPONGE LAP 18X18 RF (DISPOSABLE) ×9 IMPLANT
STRIP CLOSURE SKIN 1/2X4 (GAUZE/BANDAGES/DRESSINGS) ×2 IMPLANT
SUT PLAIN 2 0 (SUTURE) ×2
SUT PLAIN ABS 2-0 CT1 27XMFL (SUTURE) ×1 IMPLANT
SUT VIC AB 0 CTX 36 (SUTURE) ×10
SUT VIC AB 0 CTX36XBRD ANBCTRL (SUTURE) ×5 IMPLANT
SUT VIC AB 2-0 CT1 27 (SUTURE) ×2
SUT VIC AB 2-0 CT1 TAPERPNT 27 (SUTURE) ×1 IMPLANT
SUT VIC AB 4-0 KS 27 (SUTURE) ×3 IMPLANT
TOWEL OR 17X24 6PK STRL BLUE (TOWEL DISPOSABLE) ×3 IMPLANT
TRAY FOLEY W/BAG SLVR 14FR LF (SET/KITS/TRAYS/PACK) ×3 IMPLANT
WATER STERILE IRR 1000ML POUR (IV SOLUTION) ×3 IMPLANT

## 2018-02-02 NOTE — H&P (Signed)
LABOR AND DELIVERY ADMISSION HISTORY AND PHYSICAL NOTE  Elizabeth Brennan is a 38 y.o. female [id number] with IUP at [redacted]w[redacted]d by LMP presenting for SOL.  Contractions started last night at 5pm. Increasing frequency and pain.   She reports positive fetal movement. She denies leakage of fluid or vaginal bleeding.  Prenatal History/Complications: PNC at HD Pregnancy complications:  - AMA - Grandmultiparity  Past Medical History: Past Medical History:  Diagnosis Date  . Back pain   . Medical history non-contributory     Past Surgical History: Past Surgical History:  Procedure Laterality Date  . APPENDECTOMY      Obstetrical History: OB History    Gravida  5   Para  4   Term  4   Preterm      AB      Living  4     SAB      TAB      Ectopic      Multiple      Live Births              Social History: Social History   Socioeconomic History  . Marital status: Married    Spouse name: Not on file  . Number of children: Not on file  . Years of education: Not on file  . Highest education level: Not on file  Occupational History  . Not on file  Social Needs  . Financial resource strain: Not on file  . Food insecurity:    Worry: Not on file    Inability: Not on file  . Transportation needs:    Medical: Not on file    Non-medical: Not on file  Tobacco Use  . Smoking status: Never Smoker  . Smokeless tobacco: Never Used  Substance and Sexual Activity  . Alcohol use: No  . Drug use: No  . Sexual activity: Yes  Lifestyle  . Physical activity:    Days per week: Not on file    Minutes per session: Not on file  . Stress: Not on file  Relationships  . Social connections:    Talks on phone: Not on file    Gets together: Not on file    Attends religious service: Not on file    Active member of club or organization: Not on file    Attends meetings of clubs or organizations: Not on file    Relationship status: Not on file  Other Topics Concern  . Not on file   Social History Narrative  . Not on file    Family History: History reviewed. No pertinent family history.  Allergies: No Known Allergies  Medications Prior to Admission  Medication Sig Dispense Refill Last Dose  . cyclobenzaprine (FLEXERIL) 10 MG tablet Take 1 tablet (10 mg total) by mouth 2 (two) times daily as needed for muscle spasms. 20 tablet 0   . cyclobenzaprine (FLEXERIL) 10 MG tablet Take 1 tablet (10 mg total) by mouth 2 (two) times daily as needed for muscle spasms. 20 tablet 0   . doxycycline (VIBRAMYCIN) 100 MG capsule Take 1 capsule (100 mg total) by mouth 2 (two) times daily. 20 capsule 0   . ibuprofen (ADVIL,MOTRIN) 600 MG tablet Take 1 tablet (600 mg total) by mouth every 6 (six) hours as needed. 30 tablet 0      Review of Systems  All systems reviewed and negative except as stated in HPI  Physical Exam Blood pressure 113/67, pulse 81, temperature 98.7 F (37.1 C), temperature source  Oral, resp. rate 16, height 5\' 7"  (1.702 m), weight 90.3 kg, last menstrual period 04/24/2017, SpO2 100 %. General appearance: alert, oriented, NAD Lungs: normal respiratory effort Heart: regular rate Abdomen: soft, non-tender; gravid, FH appropriate for GA Extremities: No calf swelling or tenderness Presentation: cephalic Fetal monitoring: Baseline 130s/mod var/variable decels/+ acels Uterine activity: regular Q3-6462m Dilation: (P) 7.5 Effacement (%): (P) 90 Station: (P) -2 Exam by:: (P) J.Cox, RN  Prenatal labs: ABO, Rh: --/--/B POS (01/21 0901) Antibody: NEG (01/21 0901) Rubella: Immune (07/11 0000) RPR: Nonreactive (07/11 0000)  HBsAg: Negative (07/11 0000)  HIV: Non-reactive (07/11 0000)  GC/Chlamydia: negative GBS:   negative 2-hr GTT: normal Genetic screening:  NA Anatomy US: mildly shortened femur  Prenatal Transfer Tool  Maternal Diabetes: No Genetic Screening: Declined Maternal Ultrasounds/Referrals: Normal Fetal Ultrasounds or other Referrals:   None Maternal Substance Abuse:  No Significant Maternal Medications:  None Significant Maternal Lab Results: None  Results for orders placed or performed during the hospital encounter of 02/02/18 (from the past 24 hour(s))  CBC   Collection Time: 02/02/18  9:01 AM  Result Value Ref Range   WBC 7.3 4.0 - 10.5 K/uL   RBC 4.48 3.87 - 5.11 MIL/uL   Hemoglobin 11.3 (L) 12.0 - 15.0 g/dL   HCT 16.135.7 (L) 09.636.0 - 04.546.0 %   MCV 79.7 (L) 80.0 - 100.0 fL   MCH 25.2 (L) 26.0 - 34.0 pg   MCHC 31.7 30.0 - 36.0 g/dL   RDW 40.917.0 (H) 81.111.5 - 91.415.5 %   Platelets 128 (L) 150 - 400 K/uL   nRBC 0.0 0.0 - 0.2 %  Type and screen   Collection Time: 02/02/18  9:01 AM  Result Value Ref Range   ABO/RH(D) B POS    Antibody Screen NEG    Sample Expiration      02/05/2018 Performed at [facility], [address] [place], [place] [id number]     Patient Active Problem List   Diagnosis Date Noted  . Post-dates pregnancy 02/02/2018    Assessment: Elizabeth Karie Mainlandli is a 38 y.o. [id number] at 2923w4d here for SOL  #Labor: Active. Will augment if no change at next check #Pain: Desires epidural #FWB: Cat 2 (reassuring) #ID:  GBS neg #MOF: Breast #MOC: Mirena #Circ:  No  Gwenevere AbbotNimeka Darwyn Ponzo, MD  02/02/2018, 2:55 PM

## 2018-02-02 NOTE — Anesthesia Procedure Notes (Signed)
Epidural Patient location during procedure: OB Start time: 02/02/2018 10:00 AM End time: 02/02/2018 10:12 AM  Staffing Anesthesiologist: Lewie Loron, MD Performed: anesthesiologist   Preanesthetic Checklist Completed: patient identified, pre-op evaluation, timeout performed, IV checked, risks and benefits discussed and monitors and equipment checked  Epidural Patient position: sitting Prep: site prepped and draped and DuraPrep Patient monitoring: heart rate, continuous pulse ox and blood pressure Approach: midline Location: L2-L3 Injection technique: LOR air and LOR saline  Needle:  Needle type: Tuohy  Needle gauge: 17 G Needle length: 9 cm Needle insertion depth: 5 cm Catheter type: closed end flexible Catheter size: 19 Gauge Catheter at skin depth: 10 cm Test dose: negative  Assessment Sensory level: T8 Events: blood not aspirated, injection not painful, no injection resistance, negative IV test and no paresthesia  Additional Notes Reason for block:procedure for pain

## 2018-02-02 NOTE — Anesthesia Preprocedure Evaluation (Signed)
Anesthesia Evaluation  Patient identified by MRN, date of birth, ID band Patient awake    Reviewed: Allergy & Precautions, NPO status , Patient's Chart, lab work & pertinent test results  Airway Mallampati: II  TM Distance: >3 FB Neck ROM: Full    Dental  (+) Dental Advisory Given   Pulmonary neg pulmonary ROS,    Pulmonary exam normal breath sounds clear to auscultation       Cardiovascular negative cardio ROS Normal cardiovascular exam Rhythm:Regular Rate:Normal     Neuro/Psych negative neurological ROS  negative psych ROS   GI/Hepatic negative GI ROS, Neg liver ROS,   Endo/Other  negative endocrine ROS  Renal/GU negative Renal ROS     Musculoskeletal negative musculoskeletal ROS (+)   Abdominal   Peds  Hematology negative hematology ROS (+)   Anesthesia Other Findings   Reproductive/Obstetrics (+) Pregnancy                             Anesthesia Physical Anesthesia Plan  ASA: II  Anesthesia Plan: Epidural   Post-op Pain Management:    Induction:   PONV Risk Score and Plan:   Airway Management Planned:   Additional Equipment:   Intra-op Plan:   Post-operative Plan:   Informed Consent: I have reviewed the patients History and Physical, chart, labs and discussed the procedure including the risks, benefits and alternatives for the proposed anesthesia with the patient or authorized representative who has indicated his/her understanding and acceptance.       Plan Discussed with:   Anesthesia Plan Comments:         Anesthesia Quick Evaluation  

## 2018-02-02 NOTE — Progress Notes (Signed)
Risks and benefits of c/s discussed with patient at this time by [facility] MD. Grayland Ormond [phone] interpretor video used. Consents signed at this time.

## 2018-02-02 NOTE — MAU Note (Signed)
5th baby, intact.  [facility]. Wanting to push

## 2018-02-02 NOTE — Progress Notes (Signed)
Patient ID: Elizabeth Brennan, female   DOB: 12/18/1980, 38 y.o.   MRN: [id number] Called to see patient for decels  Variable decels, some late in timing with UCs  Pitocin turned off  Turned side to side  Dilation: 10 Dilation Complete Date: 02/02/18 Dilation Complete Time: 2025 Effacement (%): 90 Cervical Position: Anterior Station: 0 Presentation: Vertex Exam by:: Wynelle BourgeoisMarie Korine Winton CNM   Attempted pushing with very poor technique, doesn't feel pressure  IUPC inserted Amnioinfusion started Will try laboring down  With position changes  May need to restart Pitocin

## 2018-02-02 NOTE — Progress Notes (Signed)
Patient ID: Elizabeth Brennan, female   DOB: 01-Jul-1980, 38 y.o.   MRN: [id number] Pushing with limited success As time went on pushing efforts improved but baby did not descend much  Vitals:   02/02/18 2100 02/02/18 2130 02/02/18 2200 02/02/18 2231  BP: 103/77 114/67 102/72 (!) 115/97  Pulse: 93 (!) 105 (!) 111 (!) 107  Resp: 19 18 18    Temp:   99 F (37.2 C)   TempSrc:   Axillary   SpO2:      Weight:      Height:       Dr Adrian Blackwater in room to counsel patient on options and push with her Patient adamantly requesting a C/Section FHR with decels after contractions to 80s  Dilation: 10 Dilation Complete Date: 02/02/18 Dilation Complete Time: 2025 Effacement (%): 90 Cervical Position: Anterior Station: -2 Presentation: Vertex Exam by:: Wynelle Bourgeois, CNM  Dr Adrian Blackwater consented patient to procedure with interpretor Patient prepped for OR

## 2018-02-02 NOTE — Progress Notes (Signed)
Used ipad interpreter, [phone]) to explain FSE and IUPC.

## 2018-02-02 NOTE — Anesthesia Pain Management Evaluation Note (Signed)
  [facility] Pain Management Visit Note  Patient: Elizabeth Brennan, 38 y.o., female  "Hello I am a member of the anesthesia team at [facility]. We have an anesthesia team available at all times to provide care throughout the hospital, including epidural management and anesthesia for C-section. I don't know your plan for the delivery whether it a natural birth, water birth, IV sedation, nitrous supplementation, doula or epidural, but we want to meet your pain goals."   1.Was your pain managed to your expectations on prior hospitalizations?   Yes   2.What is your expectation for pain management during this hospitalization?     Epidural  3.How can we help you reach that goal? epidural  Record the patient's initial score and the patient's pain goal.   Pain: 8  Pain Goal: 8 [facility] wants you to be able to say your pain was always managed very well.  Taffany Heiser 02/02/2018

## 2018-02-02 NOTE — Progress Notes (Signed)
Patient ID: Elizabeth Brennan, female   DOB: 1980/11/01, 38 y.o.   MRN: [id number]  Patient pushed for about 1 hour with no change in fetal station. Too high for vacuum at -1 station. As baby is continuing to have late decelerations, discussed cesarean delivery.   The risks of cesarean section discussed with the patient included but were not limited to: bleeding which may require transfusion or reoperation; infection which may require antibiotics; injury to bowel, bladder, ureters or other surrounding organs; injury to the fetus; need for additional procedures including hysterectomy in the event of a life-threatening hemorrhage; placental abnormalities wth subsequent pregnancies, incisional problems, thromboembolic phenomenon and other postoperative/anesthesia complications. The patient concurred with the proposed plan, giving informed written consent for the procedure.   Patient has been NPO since this AM. She will remain NPO for procedure. Anesthesia and OR aware.  Preoperative prophylactic Ancef and azithromycin ordered on call to the OR.  To OR when ready.  Levie Heritage, DO 02/02/2018 11:26 PM

## 2018-02-02 NOTE — Progress Notes (Signed)
Patient ID: Elizabeth Brennan, female   DOB: March 21, 1980, 38 y.o.   MRN: [id number] Consulted Dr Adrian Blackwater FHR with late decels some to 94s  Discussed turning off epidural to increase sensation Oxygen applied Will start pushing again when feels more pressure.

## 2018-02-02 NOTE — Progress Notes (Signed)
LABOR PROGRESS NOTE  Elizabeth Brennan is a 38 y.o. [id number] at [redacted]w[redacted]d admitted for SOL  Subjective: Feeling comfortable with epidural No pressure just yet  Objective: BP 97/61   Pulse 87   Temp 98.7 F (37.1 C) (Oral)   Resp 16   Ht 5\' 7"  (1.702 m)   Wt 90.3 kg   LMP 04/24/2017   SpO2 100%   BMI 31.17 kg/m  or  Vitals:   02/02/18 1331 02/02/18 1401 02/02/18 1431 02/02/18 1501  BP: 111/80 113/67 (!) 88/63 97/61  Pulse: 90 81 95 87  Resp:      Temp:      TempSrc:      SpO2:      Weight:      Height:        Dilation: 8 Effacement (%): 90 Cervical Position: Anterior Station: -1 Presentation: Vertex Exam by:: Gwenevere Abbot, MD  FHT: baseline rate 130s, moderate varibility, + acel, variable decel Toco: regular q3-27m  Labs: Lab Results  Component Value Date   WBC 7.3 02/02/2018   HGB 11.3 (L) 02/02/2018   HCT 35.7 (L) 02/02/2018   MCV 79.7 (L) 02/02/2018   PLT 128 (L) 02/02/2018    Patient Active Problem List   Diagnosis Date Noted  . Post-dates pregnancy 02/02/2018    Assessment / Plan: 38 y.o. [id number] at [redacted]w[redacted]d here for SOL  Labor: Progressing. AROM with large amount of light meconium stained fluid Fetal Wellbeing:  Cat 2 (reassuring) Pain Control:  Epidural in place Anticipated MOD:  SVD  Gwenevere Abbot, MD  OB Fellow  02/02/2018, 3:10 PM

## 2018-02-03 ENCOUNTER — Encounter (HOSPITAL_COMMUNITY): Payer: Self-pay | Admitting: Neonatal-Perinatal Medicine

## 2018-02-03 DIAGNOSIS — O324XX0 Maternal care for high head at term, not applicable or unspecified: Secondary | ICD-10-CM

## 2018-02-03 DIAGNOSIS — Z3A40 40 weeks gestation of pregnancy: Secondary | ICD-10-CM

## 2018-02-03 DIAGNOSIS — O480 Post-term pregnancy: Secondary | ICD-10-CM

## 2018-02-03 LAB — CBC
HCT: 23.2 % — ABNORMAL LOW (ref 36.0–46.0)
HCT: 21.9 % — ABNORMAL LOW (ref 36.0–46.0)
HEMOGLOBIN: 7.1 g/dL — AB (ref 12.0–15.0)
Hemoglobin: 7.2 g/dL — ABNORMAL LOW (ref 12.0–15.0)
MCH: 24.7 pg — ABNORMAL LOW (ref 26.0–34.0)
MCH: 25.4 pg — ABNORMAL LOW (ref 26.0–34.0)
MCHC: 31 g/dL (ref 30.0–36.0)
MCHC: 32.4 g/dL (ref 30.0–36.0)
MCV: 79.5 fL — ABNORMAL LOW (ref 80.0–100.0)
MCV: 78.5 fL — ABNORMAL LOW (ref 80.0–100.0)
Platelets: 101 10*3/uL — ABNORMAL LOW (ref 150–400)
Platelets: 106 10*3/uL — ABNORMAL LOW (ref 150–400)
RBC: 2.92 MIL/uL — ABNORMAL LOW (ref 3.87–5.11)
RBC: 2.79 MIL/uL — ABNORMAL LOW (ref 3.87–5.11)
RDW: 17.4 % — ABNORMAL HIGH (ref 11.5–15.5)
RDW: 17.7 % — ABNORMAL HIGH (ref 11.5–15.5)
WBC: 13.8 10*3/uL — ABNORMAL HIGH (ref 4.0–10.5)
WBC: 14.9 10*3/uL — ABNORMAL HIGH (ref 4.0–10.5)
nRBC: 0.5 % — ABNORMAL HIGH (ref 0.0–0.2)

## 2018-02-03 LAB — RPR: RPR: NONREACTIVE

## 2018-02-03 LAB — PREPARE RBC (CROSSMATCH)

## 2018-02-03 MED ORDER — SENNOSIDES-DOCUSATE SODIUM 8.6-50 MG PO TABS
2.0000 | ORAL_TABLET | ORAL | Status: DC
  Administered 2018-02-03 – 2018-02-05 (×2): 2 via ORAL
  Filled 2018-02-03 (×2): qty 2

## 2018-02-03 MED ORDER — NALBUPHINE HCL 10 MG/ML IJ SOLN: 5.0000 mg | INTRAMUSCULAR | Status: DC | PRN

## 2018-02-03 MED ORDER — NALBUPHINE HCL 10 MG/ML IJ SOLN: 5.0000 mg | Freq: Once | INTRAMUSCULAR | Status: DC | PRN

## 2018-02-03 MED ORDER — PRENATAL MULTIVITAMIN CH
1.0000 | ORAL_TABLET | Freq: Every day | ORAL | Status: DC
  Administered 2018-02-03 – 2018-02-04 (×2): 1 via ORAL
  Filled 2018-02-03 (×2): qty 1

## 2018-02-03 MED ORDER — KETOROLAC TROMETHAMINE 30 MG/ML IJ SOLN: 30.0000 mg | Freq: Four times a day (QID) | INTRAMUSCULAR | Status: AC | PRN

## 2018-02-03 MED ORDER — MORPHINE SULFATE (PF) 0.5 MG/ML IJ SOLN
INTRAMUSCULAR | Status: DC | PRN
  Administered 2018-02-03: 3 mg via EPIDURAL

## 2018-02-03 MED ORDER — OXYCODONE-ACETAMINOPHEN 5-325 MG PO TABS: 1.0000 | ORAL_TABLET | ORAL | Status: DC | PRN

## 2018-02-03 MED ORDER — KETOROLAC TROMETHAMINE 30 MG/ML IJ SOLN
INTRAMUSCULAR | Status: AC
  Filled 2018-02-03: qty 1

## 2018-02-03 MED ORDER — FERROUS SULFATE 325 (65 FE) MG PO TABS
325.0000 mg | ORAL_TABLET | Freq: Two times a day (BID) | ORAL | Status: DC
  Administered 2018-02-03 – 2018-02-05 (×4): 325 mg via ORAL
  Filled 2018-02-03 (×4): qty 1

## 2018-02-03 MED ORDER — SODIUM CHLORIDE 0.9 % IR SOLN
Status: DC | PRN
  Administered 2018-02-03: 1000 mL

## 2018-02-03 MED ORDER — SODIUM CHLORIDE 0.9% IV SOLUTION: Freq: Once | INTRAVENOUS | Status: DC

## 2018-02-03 MED ORDER — SCOPOLAMINE 1 MG/3DAYS TD PT72
MEDICATED_PATCH | TRANSDERMAL | Status: AC
  Filled 2018-02-03: qty 1

## 2018-02-03 MED ORDER — TRANEXAMIC ACID-NACL 1000-0.7 MG/100ML-% IV SOLN
INTRAVENOUS | Status: AC
  Filled 2018-02-03: qty 100

## 2018-02-03 MED ORDER — OXYTOCIN 10 UNIT/ML IJ SOLN
INTRAVENOUS | Status: DC | PRN
  Administered 2018-02-03: 40 [IU] via INTRAVENOUS

## 2018-02-03 MED ORDER — MEPERIDINE HCL 25 MG/ML IJ SOLN: 6.2500 mg | INTRAMUSCULAR | Status: DC | PRN

## 2018-02-03 MED ORDER — DEXAMETHASONE SODIUM PHOSPHATE 10 MG/ML IJ SOLN
INTRAMUSCULAR | Status: DC | PRN
  Administered 2018-02-03: 4 mg via INTRAVENOUS

## 2018-02-03 MED ORDER — SODIUM CHLORIDE 0.9% FLUSH: 3.0000 mL | INTRAVENOUS | Status: DC | PRN

## 2018-02-03 MED ORDER — SIMETHICONE 80 MG PO CHEW
80.0000 mg | CHEWABLE_TABLET | Freq: Three times a day (TID) | ORAL | Status: DC
  Administered 2018-02-03 – 2018-02-04 (×4): 80 mg via ORAL
  Filled 2018-02-03 (×6): qty 1

## 2018-02-03 MED ORDER — DIPHENHYDRAMINE HCL 50 MG/ML IJ SOLN: 12.5000 mg | INTRAMUSCULAR | Status: DC | PRN

## 2018-02-03 MED ORDER — LACTATED RINGERS IV SOLN
INTRAVENOUS | Status: DC | PRN
  Administered 2018-02-03: via INTRAVENOUS

## 2018-02-03 MED ORDER — SCOPOLAMINE 1 MG/3DAYS TD PT72
1.0000 | MEDICATED_PATCH | Freq: Once | TRANSDERMAL | Status: DC
  Administered 2018-02-03: 1.5 mg via TRANSDERMAL

## 2018-02-03 MED ORDER — ZOLPIDEM TARTRATE 5 MG PO TABS: 5.0000 mg | ORAL_TABLET | Freq: Every evening | ORAL | Status: DC | PRN

## 2018-02-03 MED ORDER — FENTANYL CITRATE (PF) 100 MCG/2ML IJ SOLN: 25.0000 ug | INTRAMUSCULAR | Status: DC | PRN

## 2018-02-03 MED ORDER — METOCLOPRAMIDE HCL 5 MG/ML IJ SOLN: 10.0000 mg | Freq: Once | INTRAMUSCULAR | Status: DC | PRN

## 2018-02-03 MED ORDER — NALOXONE HCL 4 MG/10ML IJ SOLN
1.0000 ug/kg/h | INTRAVENOUS | Status: DC | PRN
  Filled 2018-02-03: qty 5

## 2018-02-03 MED ORDER — LACTATED RINGERS IV SOLN: INTRAVENOUS | Status: DC

## 2018-02-03 MED ORDER — WITCH HAZEL-GLYCERIN EX PADS: 1.0000 "application " | MEDICATED_PAD | CUTANEOUS | Status: DC | PRN

## 2018-02-03 MED ORDER — TRANEXAMIC ACID-NACL 1000-0.7 MG/100ML-% IV SOLN
INTRAVENOUS | Status: DC | PRN
  Administered 2018-02-03: 1000 mg via INTRAVENOUS

## 2018-02-03 MED ORDER — ACETAMINOPHEN 500 MG PO TABS
ORAL_TABLET | ORAL | Status: AC
  Filled 2018-02-03: qty 2

## 2018-02-03 MED ORDER — OXYTOCIN 40 UNITS IN NORMAL SALINE INFUSION - SIMPLE MED: 2.5000 [IU]/h | INTRAVENOUS | Status: AC

## 2018-02-03 MED ORDER — SIMETHICONE 80 MG PO CHEW
80.0000 mg | CHEWABLE_TABLET | ORAL | Status: DC | PRN
  Administered 2018-02-05: 80 mg via ORAL

## 2018-02-03 MED ORDER — LACTATED RINGERS IV SOLN
INTRAVENOUS | Status: DC
  Administered 2018-02-03 – 2018-02-04 (×4): via INTRAVENOUS

## 2018-02-03 MED ORDER — IBUPROFEN 600 MG PO TABS
600.0000 mg | ORAL_TABLET | Freq: Four times a day (QID) | ORAL | Status: DC | PRN
  Administered 2018-02-03 – 2018-02-05 (×7): 600 mg via ORAL
  Filled 2018-02-03 (×6): qty 1

## 2018-02-03 MED ORDER — COCONUT OIL OIL: 1.0000 "application " | TOPICAL_OIL | Status: DC | PRN

## 2018-02-03 MED ORDER — ACETAMINOPHEN 500 MG PO TABS
1000.0000 mg | ORAL_TABLET | Freq: Four times a day (QID) | ORAL | Status: AC
  Administered 2018-02-03 (×3): 1000 mg via ORAL
  Filled 2018-02-03 (×2): qty 2

## 2018-02-03 MED ORDER — MENTHOL 3 MG MT LOZG: 1.0000 | LOZENGE | OROMUCOSAL | Status: DC | PRN

## 2018-02-03 MED ORDER — NALOXONE HCL 0.4 MG/ML IJ SOLN: 0.4000 mg | INTRAMUSCULAR | Status: DC | PRN

## 2018-02-03 MED ORDER — SIMETHICONE 80 MG PO CHEW
80.0000 mg | CHEWABLE_TABLET | ORAL | Status: DC
  Administered 2018-02-03 – 2018-02-05 (×2): 80 mg via ORAL
  Filled 2018-02-03 (×2): qty 1

## 2018-02-03 MED ORDER — TETANUS-DIPHTH-ACELL PERTUSSIS 5-2.5-18.5 LF-MCG/0.5 IM SUSP: 0.5000 mL | Freq: Once | INTRAMUSCULAR | Status: DC

## 2018-02-03 MED ORDER — DIPHENHYDRAMINE HCL 25 MG PO CAPS: 25.0000 mg | ORAL_CAPSULE | Freq: Four times a day (QID) | ORAL | Status: DC | PRN

## 2018-02-03 MED ORDER — DIPHENHYDRAMINE HCL 25 MG PO CAPS
25.0000 mg | ORAL_CAPSULE | ORAL | Status: DC | PRN
  Filled 2018-02-03: qty 1

## 2018-02-03 MED ORDER — DEXAMETHASONE SODIUM PHOSPHATE 4 MG/ML IJ SOLN
INTRAMUSCULAR | Status: AC
  Filled 2018-02-03: qty 1

## 2018-02-03 MED ORDER — ONDANSETRON HCL 4 MG/2ML IJ SOLN: 4.0000 mg | Freq: Three times a day (TID) | INTRAMUSCULAR | Status: DC | PRN

## 2018-02-03 MED ORDER — DIBUCAINE 1 % RE OINT: 1.0000 "application " | TOPICAL_OINTMENT | RECTAL | Status: DC | PRN

## 2018-02-03 NOTE — Lactation Note (Signed)
This note was copied from a baby's chart. Lactation Consultation Note  Patient Name: Elizabeth Brennan [id number] Date: 02/03/2018 Reason for consult: Initial assessment;Term  P5 mother whose infant is now 30 hours old. Arabic interpreter, May, (#[id number]) used for interpretation.  Mother breast fed her other children for about 18 months each.  She plans to breast/bottle feed after discharge.  Mother had no questions/concerns related to breast feeding.  She feels like breast feeding is going well.  I did not assess her breasts and nipples but she stated that they are "good" and  without breakdown.    Encouraged to feed 8-12 times/24 hours or sooner if baby shows feeding cues.  She is familiar with hand expression and will perform this before/after feedings.  Colostrum container provided and milk storage times reviewed.  Encouraged mother to call for latch assistance as needed.  Visitors in room and baby was getting a hearing screen.  Mom made aware of O/P services, breastfeeding support groups, community resources, and our phone # for post-discharge questions.    Maternal Data Formula Feeding for Exclusion: No Has patient been taught Hand Expression?: Yes Does the patient have breastfeeding experience prior to this delivery?: Yes  Feeding    LATCH Score                   Interventions    Lactation Tools Discussed/Used     Consult Status Consult Status: Follow-up Date: 02/03/18 Follow-up type: In-patient    Kathrine Rieves R Kenedy Haisley 02/03/2018, 1:53 PM

## 2018-02-03 NOTE — Progress Notes (Signed)
Patient seen at request of RN for right leg numbness/weakness following C/S last evening. Patient initially evaluated at 12:30 today with Arabic interpreter. Patient c/o decreased sensation distal to right knee. Able to move right foot but felt unsteady walking on it. States her weakness and decreased sensation have improved since last evening. Denies any back pain or other symptoms.  Pt reevaluated at 17:30 today. Sensation and motor function improving. Decreased sensation now distal to mid-shin. Able to move foot and ankle freely. Has not attempted ambulation yet. Asked RN to update anesthesiologist with any changes.  Amalia Greenhouse, MD

## 2018-02-03 NOTE — Progress Notes (Signed)
Patient ID: Elizabeth Brennan, female   DOB: February 18, 1980, 38 y.o.   MRN: [id number] Vitals:   02/03/18 0215 02/03/18 0315 02/03/18 0415 02/03/18 0515  BP: (!) 99/57 104/72 100/64 93/64  Pulse: (!) 117 99 100 97  Resp: (!) 23 20 20 18   Temp:  98.6 F (37 C) 98.5 F (36.9 C) 97.9 F (36.6 C)  TempSrc:      SpO2: 94% 98% 95% 95%  Weight:      Height:       2 units PRBC ordered by Dr Adrian Blackwater Patient refused blood Consent read to pt by RN with interpretor, she refused  Will discuss with team and monitor her tolerance to being OOB

## 2018-02-03 NOTE — Op Note (Addendum)
Elizabeth Brennan PROCEDURE DATE: 02/02/2018 - 02/03/2018  PREOPERATIVE DIAGNOSIS: Intrauterine pregnancy at  [redacted]w[redacted]d weeks gestation; failure to progress: arrest of descent and non-reassuring fetal status  POSTOPERATIVE DIAGNOSIS: The same  PROCEDURE: Primary Low Transverse Cesarean Section  SURGEON:  Dr. Candelaria Celeste  ASSISTANT:   INDICATIONS: Elizabeth Brennan is a 38 y.o. [id number] at [redacted]w[redacted]d scheduled for cesarean section secondary to failure to progress: arrest of descent and non-reassuring fetal status.  The risks of cesarean section discussed with the patient included but were not limited to: bleeding which may require transfusion or reoperation; infection which may require antibiotics; injury to bowel, bladder, ureters or other surrounding organs; injury to the fetus; need for additional procedures including hysterectomy in the event of a life-threatening hemorrhage; placental abnormalities wth subsequent pregnancies, incisional problems, thromboembolic phenomenon and other postoperative/anesthesia complications. The patient concurred with the proposed plan, giving informed written consent for the procedure.    FINDINGS:  Viable female infant in vtx, OP presentation.  Apgars 4 and 10, weight pending.  Meconium stained amniotic fluid.  Intact placenta, three vessel cord.  Normal uterus, fallopian tubes and ovaries bilaterally. Cord pH 7.2  ANESTHESIA:    Spinal INTRAVENOUS FLUIDS: 3500 ml ESTIMATED BLOOD LOSS: 1480 ml URINE OUTPUT:  100 ml SPECIMENS: Placenta sent to pathology COMPLICATIONS: None immediate  PROCEDURE IN DETAIL:  The patient received intravenous antibiotics and had sequential compression devices applied to her lower extremities while in the preoperative area.  She was then taken to the operating room where epidural anesthesia was dosed up to surgical level and was found to be adequate. She was then placed in a dorsal supine position with a leftward tilt, and prepped and draped in a sterile  manner.  A foley catheter had already been placed into her bladder and attached to constant gravity, which drained clear fluid throughout.  After an adequate timeout was performed, a Pfannenstiel skin incision was made with scalpel 3cm superior from the pubic bone and carried through to the underlying layer of fascia. The fascia was incised in the midline and this incision was extended bilaterally using the Mayo scissors. Kocher clamps were applied to the superior aspect of the fascial incision and the underlying rectus muscles were dissected off bluntly. A similar process was carried out on the inferior aspect of the facial incision. The rectus muscles were separated in the midline bluntly and the peritoneum was entered bluntly.   Upon entry of the peritoneal cavity, the bladder was extremely predominant to the extent of not being able to visualize the uterus. In order to help with visualization, an Alexis retractor was placed.  The bladder reflection was then visualized, but at the superior part of visualized abdominal cavity. Attention was turned to the lower uterine segment where the vesicoperitoneum was elevated, incised with Metzenbaum scissors, and the bladder was carefully retracted to create a bladder flap. A transverse hysterotomy was made with a scalpel and extended bilaterally bluntly. In delivering the baby's head, it was noted that the baby was OP, deflexed position, against the pubic bone. The infant was successfully delivered, and cord was immediately clamped and cut and infant was handed over to awaiting neonatology team. Profuse bleeding was noted from the uterus. Uterine massage was then administered and the placenta delivered intact with three-vessel cord. The uterus was then cleared of clot and debris.    The hysterotomy was closed with 0 Vicryl in a running locked fashion, and an imbricating layer was also placed with a 0 Vicryl.  There was an inferior extension of the hysterotomy on the  left, which was also repaired in two layers. Overall, excellent hemostasis was noted. The abdomen and the pelvis were cleared of all clot and debris and the Jon Gillslexis was removed. Hemostasis was confirmed on all surfaces.  The peritoneum was reapproximated using 2-0 vicryl running stitches. The fascia was then closed using 0 Vicryl in a running fashion. The subcutaneous layer was reapproximated with plain gut and the skin was closed with 4-0 vicryl. The patient tolerated the procedure well. Sponge, lap, instrument and needle counts were correct x 2. She was taken to the recovery room in stable condition.    Levie HeritageStinson, Ugochi Henzler J, DO 02/03/2018 1:02 AM

## 2018-02-03 NOTE — Progress Notes (Signed)
Through interperter blood transfusion order instructed to pt. Pt. Refused transfusion Wynelle Bourgeois CNM notified

## 2018-02-03 NOTE — Progress Notes (Signed)
Spoke with Dr. Richardson Landry from anesthesia, since patient is progressing we will continue to monitor at this time. Dr. Richardson Landry states that anesthesia will continue to monitor and to call if her condition worsens.

## 2018-02-03 NOTE — Progress Notes (Signed)
Called Anesthesia to come and evaluate patient's inability to bear weight on her right leg. Tried to perform orthostatic vital signs, but patient unable to stand up at this time. Anesthesia came and said just to continue to monitor and try to ambulate patient in a few more hours. Will notify anesthesia again if patient is unable to bear weight again.

## 2018-02-03 NOTE — Progress Notes (Signed)
When attempting to stand at bedside assisted by nurse, pt became dizzy and sat down on the bed.   Tylene Fantasia, RN 02/03/2018 11:51 PM

## 2018-02-03 NOTE — Transfer of Care (Signed)
Immediate Anesthesia Transfer of Care Note  Patient: Elizabeth Brennan  Procedure(s) Performed: CESAREAN SECTION (N/A )  Patient Location: PACU  Anesthesia Type:Epidural  Level of Consciousness: awake  Airway & Oxygen Therapy: Patient Spontanous Breathing  Post-op Assessment: Report given to RN and Post -op Vital signs reviewed and stable  Post vital signs: stable  Last Vitals:  Vitals Value Taken Time  BP 108/57 02/03/2018  1:17 AM  Temp    Pulse 120 02/03/2018  1:18 AM  Resp 27 02/03/2018  1:18 AM  SpO2 100 % 02/03/2018  1:18 AM  Vitals shown include unvalidated device data.  Last Pain:  Vitals:   02/02/18 2200  TempSrc: Axillary  PainSc:          Complications: No apparent anesthesia complications

## 2018-02-03 NOTE — Plan of Care (Signed)
Pts. Condition will continue to improve 

## 2018-02-03 NOTE — Progress Notes (Signed)
Tried to ambulate patient at bedside again, but right leg continues to be non-weight bearing.  Asked patient via interpreter if she can feel past her knee now, since earlier patient could only feel to her knee and now she can feel about halfway down her shin.  Since patient is progressing will continue to monitor and call anesthesia if no more progress is made.

## 2018-02-03 NOTE — Anesthesia Postprocedure Evaluation (Signed)
Anesthesia Post Note  Patient: Elizabeth Brennan  Procedure(s) Performed: CESAREAN SECTION (N/A )     Patient location during evaluation: Mother Baby Anesthesia Type: Epidural Level of consciousness: awake and alert and oriented Pain management: pain level controlled Vital Signs Assessment: post-procedure vital signs reviewed and stable Respiratory status: spontaneous breathing and nonlabored ventilation Cardiovascular status: stable Postop Assessment: no headache, no backache, patient able to bend at knees, no signs of nausea or vomiting, adequate PO intake and epidural receding Anesthetic complications: no    Last Vitals:  Vitals:   02/03/18 0515 02/03/18 0615  BP: 93/64 98/62  Pulse: 97 96  Resp: 18 18  Temp: 36.6 C 36.8 C  SpO2: 95% 95%    Last Pain:  Vitals:   02/03/18 0710  TempSrc:   PainSc: 0-No pain   Pain Goal:                   Swayzee Wadley

## 2018-02-04 ENCOUNTER — Other Ambulatory Visit: Payer: Self-pay

## 2018-02-04 ENCOUNTER — Inpatient Hospital Stay (HOSPITAL_COMMUNITY): Payer: Medicaid Other

## 2018-02-04 ENCOUNTER — Encounter (HOSPITAL_COMMUNITY): Payer: Self-pay | Admitting: Family Medicine

## 2018-02-04 LAB — CBC
HCT: 19.9 % — ABNORMAL LOW (ref 36.0–46.0)
Hemoglobin: 6.5 g/dL — CL (ref 12.0–15.0)
MCH: 25.9 pg — ABNORMAL LOW (ref 26.0–34.0)
MCHC: 32.7 g/dL (ref 30.0–36.0)
MCV: 79.3 fL — ABNORMAL LOW (ref 80.0–100.0)
NRBC: 0 % (ref 0.0–0.2)
PLATELETS: 116 10*3/uL — AB (ref 150–400)
RBC: 2.51 MIL/uL — ABNORMAL LOW (ref 3.87–5.11)
RDW: 17.7 % — ABNORMAL HIGH (ref 11.5–15.5)
WBC: 12.3 10*3/uL — ABNORMAL HIGH (ref 4.0–10.5)

## 2018-02-04 MED ORDER — ENOXAPARIN SODIUM 40 MG/0.4ML ~~LOC~~ SOLN
40.0000 mg | SUBCUTANEOUS | Status: DC
  Administered 2018-02-04: 40 mg via SUBCUTANEOUS
  Filled 2018-02-04: qty 0.4

## 2018-02-04 MED ORDER — CYCLOBENZAPRINE HCL 5 MG PO TABS
5.0000 mg | ORAL_TABLET | Freq: Three times a day (TID) | ORAL | Status: DC
  Administered 2018-02-04 – 2018-02-05 (×3): 5 mg via ORAL
  Filled 2018-02-04 (×3): qty 1

## 2018-02-04 MED ORDER — SODIUM CHLORIDE 0.9 % IV SOLN
510.0000 mg | Freq: Once | INTRAVENOUS | Status: AC
  Administered 2018-02-04: 510 mg via INTRAVENOUS
  Filled 2018-02-04: qty 17

## 2018-02-04 NOTE — Progress Notes (Signed)
Post Partum/Op Day 1  Subjective: The patient is seen resting and relaxing in bed with her husband holding their sleeping newborn child at bedside.   This interview was translated with the help of "Walker KehrYoussef" #[id number].  The patient was informed that her Hgb ("blood level") is very low at 6.5. I explained to her that we normally like to see this level at least above 7.0. I asked if she would like to receive a unit or two of blood which she respectfully declined. I asked her if she would be interested in receiving IV supplementation of Iron (Feraheme) and she agreed to receive this. She was also warned that it looks a lot like blood but that it is not blood and not to be alarmed by its appearance. She denies headaches, chest pain, fatigue, dizziness, and shortness of breath.  She spoke to me about the trouble she is having with moving and standing on her right leg. She states she feels like the sensation of the right leg is dulled to touch, but reports she can still feel pressure, sharp, and soft touch. She was informed that the MRI of her lumbar spine is showing a bulging disc at L4 and L5 that may have irritated her spinal cord but is NOT compressing it. She has not tried to ambulate.   Objective: Blood pressure 106/69, pulse 66, temperature 97.9 F (36.6 C), temperature source Oral, resp. rate 18, height 5\' 7"  (1.702 m), weight 90.3 kg, last menstrual period 04/24/2017, SpO2 96 %, unknown if currently breastfeeding.  Physical Exam Cardiovascular:     Rate and Rhythm: Normal rate and regular rhythm.  Pulmonary:     Effort: Pulmonary effort is normal.     Breath sounds: Normal breath sounds.  Musculoskeletal:        General: No swelling.     Right lower leg: No edema.     Left lower leg: No edema.  Neurological:     Motor: Weakness (3/5 RLE strength, 5/5 LLE strength) present. No tremor or abnormal muscle tone.     Coordination: Heel to Shin Test abnormal (Unable to perform with L leg).   Gait: Gait abnormal (R leg slides out from patient with standing).     Deep Tendon Reflexes: Babinski sign absent on the right side. Babinski sign absent on the left side.     Reflex Scores:      Patellar reflexes are 4+ on the right side and 2+ on the left side.      Achilles reflexes are 1+ on the right side and 1+ on the left side.    Comments: + R straight leg raise, - Straight leg raise    Recent Labs    02/03/18 0520 02/04/18 0901  HGB 7.1* 6.5*  HCT 21.9* 19.9*   Assessment/Plan: Microcytic, hypochromic Anemia, stable: Hgb 6.5 down from 7.1. Patient is declining Blood transfusion. She is currently denying shortness of breath, headaches, chest pain, fatigue, or dizziness, but she has also been lying in bed and not ambulating.  - Transfuse FeraHeme - f/u AM CBC 02/05/2018  R. Lower Extremity Weakness: patient has had lower extremity weakness at 3/5 with inability to stand without her leg sliding out from beneath her. Neurological physical exam findings are showing a mixed picture with symptoms of both central and peripheral etiology. MRI showing L4-L5 bulging disc without cord compression. - PT consult - appreciate recs - Flexeril 5mg  TID - Encourage safe ambulation   LOS: 2 days   Atha StarksHannah C  Anderson 02/04/2018, 3:08 PM

## 2018-02-04 NOTE — Discharge Summary (Addendum)
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 yo G5P5005 at [redacted]w[redacted]d was admitted in Active Labor on 02/02/2018. Patient had a labor course significant for arrest of descent and non-reassuring FHTs. Membrane Rupture Time/Date: 1:42 PM ,02/02/2018   The patient went for cesarean section due to Arrest of Descent and Non-Reassuring FHR, and delivered a Viable infant,02/03/2018  Details of operation can be found in separate operative note. Patient had an uncomplicated postpartum course.  She is ambulating,tolerating a regular diet, passing flatus, and urinating well.  Patient is discharged home in stable condition 02/05/18.  Physical exam  Vitals:   02/04/18 0519 02/04/18 1413 02/04/18 2200 02/05/18 0606  BP:  106/69 91/63 (!) 83/54  Pulse:  66 68 77  Resp: 16 18 18 18   Temp: 97.9 F (36.6 C) 97.9 F (36.6 C) 97.9 F (36.6 C) 98.1 F (36.7 C)  TempSrc: Oral Oral Oral Oral  SpO2:  96% 97%   Weight:      Height:       General: alert, cooperative and no distress HEENT: no conjunctival pallor bilaterally CV: heart RRR, S1S2 present, no murmurs, rubs or gallops, capillary refill  2-3 seconds Lochia: appropriate Uterine Fundus: firm Incision: Healing well with no significant drainage, Dressing is clean, dry, and intact DVT Evaluation: No evidence of DVT seen on physical exam. No significant calf/ankle edema. Neuro: Cranial nerves 2-12 grossly intact; 5/5 strength in R hip flexion, extension, plantar/dorsi-flexion, negative Babinski; 4/5 strength in L hip flexion, extension, plantar flexion, 3/5 strength on R dorsiflexion, unable to elicit Babinski; sensation intact and equal to upper and lower extremities bilaterally; ambulation shows minor occasional foot drop to R foot that waxes and wanes.  Labs: Lab Results  Component Value Date   WBC 12.3 (H) 02/04/2018   HGB 6.5 (LL) 02/04/2018   HCT 19.9 (L) 02/04/2018   MCV 79.3 (L) 02/04/2018   PLT 116 (L) 02/04/2018   CMP Latest Ref Rng & Units 11/15/2016  Glucose 65 - 99 mg/dL 95  BUN 6 - 20 mg/dL 9  Creatinine 6.80 - 8.81 mg/dL 1.03  Sodium 159 - 458 mmol/L 133(L)  Potassium 3.5 - 5.1 mmol/L 3.4(L)  Chloride 101 - 111 mmol/L 105  CO2 22 - 32 mmol/L 20(L)  Calcium 8.9 - 10.3 mg/dL  8.8(L)  Total Protein 6.5 - 8.1 g/dL 8.0  Total Bilirubin 0.3 - 1.2 mg/dL 0.4  Alkaline Phos 38 - 126 U/L 63  AST 15 - 41 U/L 23  ALT 14 - 54 U/L 11(L)    Discharge instruction: per After Visit Summary and "Baby and Me Booklet".  After visit meds:  Allergies as of 02/05/2018   No Known Allergies     Medication List    TAKE these medications   ibuprofen 600 MG tablet Commonly known as:  ADVIL,MOTRIN Take 1 tablet (600 mg total) by mouth every 6 (six) hours as needed for mild pain.   oxyCODONE-acetaminophen 5-325 MG tablet Commonly known as:  PERCOCET/ROXICET Take 1-2 tablets by mouth every 4 (four) hours as needed for moderate pain.   prenatal multivitamin Tabs tablet Take 1 tablet by mouth daily at 12 noon.   senna-docusate 8.6-50 MG tablet Commonly known as:  Senokot-S Take 2 tablets by mouth daily. Start taking on:   February 06, 2018     Diet: routine diet  Activity: Advance as tolerated. Pelvic rest for 6 weeks.   Outpatient follow up:2 weeks Follow up Appt:No future appointments. Follow up Visit:No follow-ups on file.  Postpartum contraception: IUD Mirena  Newborn Data: Live born female  Birth Weight: 10 lb 4.2 oz (4655 g) APGAR: 4, 10  Newborn Delivery   Birth date/time:  02/03/2018 00:11:00 Delivery type:  C-Section, Low Transverse Trial of labor:  No C-section categorization:  Primary    Baby Feeding: Bottle and Breast Disposition:home with mother  02/05/2018 [name], DO  OB FELLOW DISCHARGE ATTESTATION  I have seen and examined this patient and agree with above documentation in the resident's note.   Gwenevere Abbot, MD  OB Fellow  02/05/2018, 10:24 PM

## 2018-02-04 NOTE — Progress Notes (Signed)
[facility]. Procedure explained to patient with use of interpreter. Husband asked if he could accompany patient to the MRI. Informed him that I was unsure but would confirm when time given for MRI. Patient stated no questions.

## 2018-02-04 NOTE — Progress Notes (Addendum)
Post Partum Day #1 Subjective: tolerating PO and + flatus. Pt states she has not walked since her procedure. She complains of numbness in her right leg from her lower shin down to her foot, and says she is unable to bear weight on that leg.   Objective: Blood pressure 94/62, pulse 79, temperature 97.9 F (36.6 C), temperature source Oral, resp. rate 16, height 5\' 7"  (1.702 m), weight 90.3 kg, last menstrual period 04/24/2017, SpO2 95 %, unknown if currently breastfeeding.   Physical Exam:  General: alert, cooperative and no distress Lochia: appropriate Uterine Fundus: firm Incision: no significant drainage DVT Evaluation: No evidence of DVT seen on physical exam. No cords or calf tenderness. No significant calf/ankle edema. +Bowel sounds  Recent Labs    02/03/18 0210 02/03/18 0520  HGB 7.2* 7.1*  HCT 23.2* 21.9*    Assessment/Plan: Continue to monitor right leg numbness Plans to breast and bottle feed Plans for outpatient circumcision    LOS: 2 days   Isabel Caprice 02/04/2018, 7:54 AM

## 2018-02-04 NOTE — Progress Notes (Signed)
When attempting to stand at the bedside pt became weak in the right leg and sat down. No c/o pain or dizziness at this time. Notified Dr. Renold DonGermeroth, who stated he will evaluate pt.   Tylene FantasiaBelinda Alpha Chouinard, RN 02/04/2018 6:28 AM

## 2018-02-04 NOTE — Progress Notes (Signed)
Orthostatics performed on patient. Offered ambulation to patient and patient wanted to try walking to bathroom. RN assisted with help from Phillips Hay, RN. Patient able to bear weight on leg and ambulated to the bathroom. Patient still unsteady on feet and advised not to try to ambulate on own just yet. Foley catheter left in place for now due to patients inability to ambulate well. Will call physician for further instructions and pass on to night shift RN.

## 2018-02-04 NOTE — Progress Notes (Signed)
Foley still in place, patient feels dizzy with standing, unable to ambulate. Notified Dr. Evonnie Dawes, no new order. Will continue to monitor.   Tylene Fantasia, RN 02/04/2018 1:47 AM

## 2018-02-04 NOTE — Progress Notes (Addendum)
Anesthesia Eval Pt. Still complaining of RLE weakness. Per patient sensation is back to baseline. I examined her and found 5/5 plantarflexion, but 1/5 dorsiflexion on R, 5/5 on left. She claimed to be unable to extend her R leg, however was able to move it in bed some. It was quite difficult to determine whether this was poor effort or true paresis. Given she did push for `at least 1 hour per notes, I suspect positioning during this period may have contributed.   I placed her epidural and she had no paresthesia during the procedure. I will speak with the obstetricians and discuss possible neurology consult and/or imaging to rule out epidural hematoma. Discussed with Dr. Despina Hidden and he requested I order an MRI. Discussed with Neurologist on call Otelia Limes) and he recommended lumbar MRI without contrast. Discussed with Eure. He will pass along to his service at sign out. I relayed this to the RN in mother baby and to the patient.   I also clarified what happened during the delivery with the [facility] interpreter service. 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 I explained to the patient that she will have to be transferred via ambulance to either [facility] or [facility] for the study.

## 2018-02-05 ENCOUNTER — Encounter (HOSPITAL_COMMUNITY): Payer: Self-pay | Admitting: *Deleted

## 2018-02-05 MED ORDER — OXYCODONE-ACETAMINOPHEN 5-325 MG PO TABS: 1.0000 | ORAL_TABLET | ORAL | 0 refills | Status: DC | PRN

## 2018-02-05 MED ORDER — SENNOSIDES-DOCUSATE SODIUM 8.6-50 MG PO TABS: 2.0000 | ORAL_TABLET | ORAL | 0 refills | Status: DC

## 2018-02-05 MED ORDER — IBUPROFEN 600 MG PO TABS: 600.0000 mg | ORAL_TABLET | Freq: Four times a day (QID) | ORAL | 1 refills | Status: DC | PRN

## 2018-02-05 NOTE — Progress Notes (Signed)
All DC instructions done via live interpreter. Mom told when to take her dressing off and she is aware of all appointments . Patient told to make appointment with her OB. None was scheduled on her DC papers that I could see. All questions answered.

## 2018-02-05 NOTE — Evaluation (Signed)
Physical Therapy Evaluation Patient Details Name: Elizabeth Brennan MRN: [id number] DOB: 1980-10-13 Today's Date: 02/05/2018   History of Present Illness  Pt s/p childbirth by C-section. Pt with rt lower extremity weakness. MRI showed bulging disc at L4-L5 without cord compression.  Clinical Impression  Pt presents to PT with decr mobility due to RLE weakness and decr sensation after childbirth. Pt now able to amb in room and hall without overt instability but not 100% back to baseline. Pt should be able to mobilize at home but instructed pt not to amb while carrying the baby until she is back to her baseline. Recommend either sitting while holding the baby or use a stroller to push the baby while in the home. Recommend HHPT to assist pt to be able to fully return to baseline and care for the baby.    Follow Up Recommendations Home health PT    Equipment Recommendations  None recommended by PT    Recommendations for Other Services       Precautions / Restrictions Precautions Precautions: Fall Restrictions Weight Bearing Restrictions: No      Mobility  Bed Mobility Overal bed mobility: Modified Independent             General bed mobility comments: Incr time  Transfers Overall transfer level: Modified independent Equipment used: None Transfers: Sit to/from Stand Sit to Stand: Modified independent (Device/Increase time)         General transfer comment: Incr time to rise  Ambulation/Gait Ambulation/Gait assistance: Supervision Gait Distance (Feet): 75 Feet Assistive device: None Gait Pattern/deviations: Step-through pattern;Decreased stride length;Decreased dorsiflexion - right;Decreased step length - right Gait velocity: decr Gait velocity interpretation: 1.31 - 2.62 ft/sec, indicative of limited community ambulator General Gait Details: Pt with guarded gait but without gross instability. Distance limited by infant in room and unable to be out of sight of  infant  Financial trader Rankin (Stroke Patients Only)       Balance Overall balance assessment: Needs assistance Sitting-balance support: No upper extremity supported;Feet supported Sitting balance-Leahy Scale: Normal     Standing balance support: No upper extremity supported;During functional activity Standing balance-Leahy Scale: Good                               Pertinent Vitals/Pain Pain Assessment: Faces Faces Pain Scale: Hurts a little bit Pain Location: abdomen Pain Descriptors / Indicators: Operative site guarding Pain Intervention(s): Monitored during session    Home Living Family/patient expects to be discharged to:: Private residence Living Arrangements: Spouse/significant other Available Help at Discharge: Family;Available 24 hours/day Type of Home: House Home Access: Level entry     Home Layout: One level Home Equipment: None      Prior Function Level of Independence: Independent               Hand Dominance        Extremity/Trunk Assessment   Upper Extremity Assessment Upper Extremity Assessment: Overall WFL for tasks assessed    Lower Extremity Assessment Lower Extremity Assessment: RLE deficits/detail RLE Deficits / Details: strength grossly 3+/5 RLE Sensation: decreased light touch       Communication   Communication: Prefers language other than [place];Interpreter utilized([employer] Guerry [place] [phone])  Cognition Arousal/Alertness: Awake/alert Behavior During Therapy: WFL for tasks assessed/performed Overall Cognitive Status: Within Functional Limits for tasks assessed  General Comments      Exercises     Assessment/Plan    PT Assessment All further PT needs can be met in the next venue of care  PT Problem List Decreased strength;Decreased balance;Decreased mobility       PT Treatment Interventions      PT Goals  (Current goals can be found in the Care Plan section)  Acute Rehab PT Goals PT Goal Formulation: All assessment and education complete, DC therapy    Frequency     Barriers to discharge        Co-evaluation               AM-PAC PT "6 Clicks" Mobility  Outcome Measure Help needed turning from your back to your side while in a flat bed without using bedrails?: None Help needed moving from lying on your back to sitting on the side of a flat bed without using bedrails?: None Help needed moving to and from a bed to a chair (including a wheelchair)?: None Help needed standing up from a chair using your arms (e.g., wheelchair or bedside chair)?: None Help needed to walk in hospital room?: A Little Help needed climbing 3-5 steps with a railing? : A Little 6 Click Score: 22    End of Session   Activity Tolerance: Patient tolerated treatment well Patient left: in bed;with call bell/phone within reach(sitting EOB with bassinet at bedside) Nurse Communication: Mobility status PT Visit Diagnosis: Other abnormalities of gait and mobility (R26.89)    Time: 7953-6922 PT Time Calculation (min) (ACUTE ONLY): 19 min   Charges:   PT Evaluation $PT Eval Low Complexity: 1 Low          [place] Pager [phone] Office [place] 02/05/2018, 9:29 AM

## 2018-02-05 NOTE — Progress Notes (Signed)
Dr. Earlene Plater aware of patient's status and that patient has only voided x1 without measuring and she is ok with still sending her home. Patient walked to bathroom on her own and did well. No syncope noted. Patient seen by PT see note. Patient doing well. Will get ready for DC.

## 2018-02-05 NOTE — Progress Notes (Signed)
Pt. Ambulated in room with stand by assist. Right leg appears sluggish, able to bear weight. No c/o pain, dizziness or vision changes. Foley catheter removed. Pt encouraged call for assistance when getting up.   Tylene Fantasia, RN  02/05/18 5:30am

## 2018-02-06 ENCOUNTER — Encounter (HOSPITAL_COMMUNITY): Payer: Self-pay

## 2018-02-06 LAB — TYPE AND SCREEN
ABO/RH(D): B POS
Antibody Screen: NEGATIVE
Unit division: 0
Unit division: 0

## 2018-02-06 LAB — BPAM RBC
Blood Product Expiration Date: 202002102359
Blood Product Expiration Date: 202002112359
Unit Type and Rh: 5100
Unit Type and Rh: 5100

## 2018-02-28 ENCOUNTER — Inpatient Hospital Stay (HOSPITAL_BASED_OUTPATIENT_CLINIC_OR_DEPARTMENT_OTHER): Payer: Medicaid Other

## 2018-02-28 ENCOUNTER — Other Ambulatory Visit: Payer: Self-pay

## 2018-02-28 ENCOUNTER — Inpatient Hospital Stay (HOSPITAL_COMMUNITY)
Admission: AD | Admit: 2018-02-28 | Discharge: 2018-02-28 | Disposition: A | Discharge status: Home / Self Care | Payer: Medicaid Other | Source: Ambulatory Visit | Attending: Obstetrics & Gynecology | Admitting: Obstetrics & Gynecology

## 2018-02-28 ENCOUNTER — Encounter (HOSPITAL_COMMUNITY): Payer: Self-pay

## 2018-02-28 DIAGNOSIS — M5126 Other intervertebral disc displacement, lumbar region: Secondary | ICD-10-CM | POA: Diagnosis not present

## 2018-02-28 DIAGNOSIS — O26893 Other specified pregnancy related conditions, third trimester: Secondary | ICD-10-CM | POA: Insufficient documentation

## 2018-02-28 DIAGNOSIS — M79604 Pain in right leg: Secondary | ICD-10-CM | POA: Insufficient documentation

## 2018-02-28 DIAGNOSIS — M792 Neuralgia and neuritis, unspecified: Secondary | ICD-10-CM | POA: Insufficient documentation

## 2018-02-28 DIAGNOSIS — Z3A40 40 weeks gestation of pregnancy: Secondary | ICD-10-CM | POA: Diagnosis not present

## 2018-02-28 DIAGNOSIS — M5136 Other intervertebral disc degeneration, lumbar region: Secondary | ICD-10-CM

## 2018-02-28 DIAGNOSIS — O1203 Gestational edema, third trimester: Secondary | ICD-10-CM | POA: Insufficient documentation

## 2018-02-28 MED ORDER — GABAPENTIN 300 MG PO CAPS: 300.0000 mg | ORAL_CAPSULE | Freq: Three times a day (TID) | ORAL | 3 refills | Status: DC

## 2018-02-28 MED ORDER — GABAPENTIN 300 MG PO CAPS
600.0000 mg | ORAL_CAPSULE | Freq: Once | ORAL | Status: AC
  Administered 2018-02-28: 600 mg via ORAL
  Filled 2018-02-28: qty 2

## 2018-02-28 NOTE — MAU Note (Signed)
Elizabeth Brennan is a 38 y.o. here in MAU reporting: C section on Jan 22. Having right leg pain since and now it is worse. Reports some swelling but no redness. States she was given some medication for the pain while she was here and the medication works but then wears off.  Onset of complaint: ongoing  Pain score: 10/10  Vitals:   02/28/18 1826  BP: 102/70  Pulse: 99  Resp: 18  Temp: 98.1 F (36.7 C)  SpO2: 100%      Lab orders placed from triage: none

## 2018-02-28 NOTE — Discharge Instructions (Signed)
Herniated Disk  A herniated disk, also called a ruptured disk or slipped disk, occurs when a disk in the spine bulges out too far. Between the bones in the spine (vertebrae), there are oval disks that are made of a soft, spongy center that is surrounded by a tough outer ring. The disks connect your vertebrae, help your spine move, and absorb shocks from your movement. When you have a herniated disk, the spongy center of the disk bulges out or breaks through the outer ring. It can press on a nerve between the vertebrae and cause pain. This can occur anywhere in the back or neck area, but the lower back is most commonly affected. What are the causes? This condition may be caused by:  Age-related wear and tear. The spongy centers of spinal disks tend to shrink and dry out with age, which makes them more likely to herniate.  Sudden injury, such as a strain or sprain. What increases the risk? Aging is the main risk factor for a herniated disk. Other risk factors include:  Being a man who is 30-50 years old.  Frequently doing activities that involve heavy lifting, bending, or twisting.  Frequently driving for long hours at a time.  Not getting enough exercise.  Being overweight.  Smoking.  Having a family history of back problems or herniated disks.  Being pregnant or giving birth.  Having poor nutrition.  Being tall. What are the signs or symptoms? Symptoms may vary depending on where your herniated disk is located.  A herniated disk in the lower back may cause sharp pain in: ? Part of the arm, leg, hip, or buttocks. ? The back of the lower leg (calf). ? The lower back, spreading down through the leg into the foot (sciatica).  A herniated disk in the neck may cause dizziness and vertigo. It may also cause pain or weakness in: ? The neck. ? The shoulder blades. ? Upper arm, forearm, or fingers.  You may also have muscle weakness. It may be difficult to: ? Lift your leg or  arm. ? Stand on your toes. ? Squeeze tightly with one of your hands.  Other symptoms may include: ? Numbness or tingling in the affected areas of the hands, arms, feet, or legs. ? Inability to control when you urinate or when you have bowel movements. This is a rare but serious sign of a severe herniated disk in the lower back. How is this diagnosed? This condition may be diagnosed based on:  Your symptoms.  Your medical history.  A physical exam. The exam may include: ? Straight-leg test. You will lie on your back while your health care provider lifts your leg, keeping your knee straight. If you feel pain, you likely have a herniated disk. ? Neurological tests. This includes checking for numbness, reflexes, muscle strength, and posture.  Imaging tests, such as: ? X-rays. ? MRI. ? CT scan. ? Electromyogram (EMG) to check the nerves that control muscles. This test may be used to determine which nerves are affected by your herniated disk. How is this treated? Treatment for this condition may include:  A short period of rest. This is usually the first treatment. ? You may be on bed rest for up to 2 days, or you may be instructed to stay home and avoid physical activity. ? If you have a herniated disk in your lower back, avoid sitting as much as possible. Sitting increases pressure on the disk.  Medicines. These may include: ? NSAIDs   to help reduce pain and swelling. ? Muscle relaxants to prevent sudden tightening of the back muscles (back spasms). ? Prescription pain medicines, if you have severe pain.  Steroid injections in the area of the herniated disk. This can help reduce pain and swelling.  Physical therapy to strengthen your back muscles. In many cases, symptoms go away with treatment over a period of days or weeks. You will most likely be free of symptoms after 3-4 months. If other treatments do not help to relieve your symptoms, you may need surgery. Follow these  instructions at home: Medicines  Take over-the-counter and prescription medicines only as told by your health care provider.  Do not drive or use heavy machinery while taking prescription pain medicine. Activity  Rest as directed.  After your rest period: ? Return to your normal activities and gradually begin exercising as told by your health care provider. Ask your health care provider what activities and exercises are safe for you. ? Use good posture. ? Avoid movements that cause pain. ? Do not lift anything that is heavier than 10 lb (4.5 kg) until your health care provider says this is safe. ? Do not sit or stand for long periods of time without changing positions. ? Do not sit for long periods of time without getting up and moving around.  If physical therapy was prescribed, do exercises as instructed.  Aim to strengthen muscles in your back and abdomen with exercises like crunches, swimming, or walking. General instructions  Do not use any products that contain nicotine or tobacco, such as cigarettes and e-cigarettes. These products can delay healing. If you need help quitting, ask your health care provider.  Do not wear high-heeled shoes.  Do not sleep on your belly.  If you are overweight, work with your health care provider to lose weight safely.  To prevent or treat constipation while you are taking prescription pain medicine, your health care provider may recommend that you: ? Drink enough fluid to keep your urine clear or pale yellow. ? Take over-the-counter or prescription medicines. ? Eat foods that are high in fiber, such as fresh fruits and vegetables, whole grains, and beans. ? Limit foods that are high in fat and processed sugars, such as fried and sweet foods.  Keep all follow-up visits as told by your health care provider. This is important. How is this prevented?   Maintain a healthy weight.  Try to avoid stressful situations.  Maintain physical  fitness. Do at least 150 minutes of moderate-intensity exercise each week, such as brisk walking or water aerobics.  When lifting objects: ? Keep your feet at least shoulder-width apart and tighten your abdominal muscles. ? Keep your spine neutral as you bend your knees and hips. It is important to lift using the strength of your legs, not your back. Do not lock your knees straight out. ? Always ask for help to lift heavy or awkward objects. Contact a health care provider if:  You have back pain or neck pain that does not get better after 6 weeks.  You have severe pain in your back, neck, legs, or arms.  You develop numbness, tingling, or weakness in any part of your body. Get help right away if:  You cannot move your arms or legs.  You cannot control when you urinate or have bowel movements.  You feel dizzy or you faint.  You have shortness of breath. This information is not intended to replace advice given to you by   your health care provider. Make sure you discuss any questions you have with your health care provider. Document Released: 12/28/1999 Document Revised: 08/27/2015 Document Reviewed: 08/27/2015 Elsevier Interactive Patient Education  2019 Elsevier Inc.  

## 2018-02-28 NOTE — MAU Provider Note (Signed)
Chief Complaint: Leg Pain   First Provider Initiated Contact with Patient 02/28/18 1908     SUBJECTIVE HPI: Elizabeth Brennan is a 38 y.o. [id number] at 3578w5d who presents to [facility]missions reporting ongoing right leg pain and intermittent sensation of heaviness in her foot since C/S on 02/03/18. MRI showed: 1. Shallow disc protrusion at L4-5 with mass effect on the ventral thecal sac and possible irritation of the right L4 nerve root. 2. No intraspinal fluid collections or hematoma.  PT consult done. Recommended OP F/U which was not done. Pt reports brief, incomplete improvement w/ IBU. foot "heaviness" occurs when she has pain. [name] recently due to these problems and is having pain at her incision site.   Location: Right leg, primarily outer thigh  Quality: sharp. tingling Severity: moderate-severe when it occurs. Minimal pain now Duration: 2 weeks Context: Since giving birth 02/03/18 Timing: intermittent Modifying factors: partial relief w/ IBU Associated signs and symptoms: Positive for decrease sensation of outer right leg, feeling of heaviness in foot, more-so when wearing shoes, mild edema of right leg. Neg for fever, chills, incontinence, calf pain, SOB, chest pain.   Phone Arabic interpreter used.   Past Medical History:  Diagnosis Date  . Back pain   . Medical history non-contributory    OB History  Gravida Para Term Preterm AB Living  5 5 5     5   SAB TAB Ectopic Multiple Live Births        0 1    # Outcome Date GA Lbr Len/2nd Weight Sex Delivery Anes PTL Lv  5 Term 02/03/18 5578w5d 17:25 / 03:46 4655 g Wandalee FerdinandM CS-LTranv EPI  LIV  4 Term 11/23/14     Vag-Spont     3 Term 07/22/09     Vag-Spont     2 Term 01/08/06     Vag-Spont     1 Term 07/04/02     Vag-Spont      Past Surgical History:  Procedure Laterality Date  . APPENDECTOMY    . CESAREAN SECTION N/A 02/02/2018   Procedure: CESAREAN SECTION;  Surgeon: Levie HeritageStinson, Jacob J, DO;  [facility];  Service:  Obstetrics;  Laterality: N/A;   Social History   Socioeconomic History  . Marital status: Married    Spouse name: Not on file  . Number of children: Not on file  . Years of education: Not on file  . Highest education level: Not on file  Occupational History  . Not on file  Social Needs  . Financial resource strain: Not on file  . Food insecurity:    Worry: Not on file    Inability: Not on file  . Transportation needs:    Medical: Not on file    Non-medical: Not on file  Tobacco Use  . Smoking status: Never Smoker  . Smokeless tobacco: Never Used  Substance and Sexual Activity  . Alcohol use: No  . Drug use: No  . Sexual activity: Yes    Birth control/protection: None  Lifestyle  . Physical activity:    Days per week: Not on file    Minutes per session: Not on file  . Stress: Not on file  Relationships  . Social connections:    Talks on phone: Not on file    Gets together: Not on file    Attends religious service: Not on file    Active member of club or organization: Not on file    Attends meetings of clubs or organizations:  Not on file    Relationship status: Not on file  . Intimate partner violence:    Fear of current or ex partner: Not on file    Emotionally abused: Not on file    Physically abused: Not on file    Forced sexual activity: Not on file  Other Topics Concern  . Not on file  Social History Narrative  . Not on file   History reviewed. No pertinent family history. No current facility-administered medications on file prior to encounter.    Current Outpatient Medications on File Prior to Encounter  Medication Sig Dispense Refill  . ibuprofen (ADVIL,MOTRIN) 600 MG tablet Take 1 tablet (600 mg total) by mouth every 6 (six) hours as needed for mild pain. 60 tablet 1  . oxyCODONE-acetaminophen (PERCOCET/ROXICET) 5-325 MG tablet Take 1-2 tablets by mouth every 4 (four) hours as needed for moderate pain. 30 tablet 0  . Prenatal Vit-Fe Fumarate-FA  (PRENATAL MULTIVITAMIN) TABS tablet Take 1 tablet by mouth daily at 12 noon.    . senna-docusate (SENOKOT-S) 8.6-50 MG tablet Take 2 tablets by mouth daily. 20 tablet 0   No Known Allergies  I have reviewed patient's Past Medical Hx, Surgical Hx, Family Hx, Social Hx, medications and allergies.   Review of Systems  Constitutional: Negative for chills and fever.  Respiratory: Negative for shortness of breath.   Cardiovascular: Negative for chest pain.  Gastrointestinal: Positive for abdominal pain (soreness around incision after fall).  Genitourinary:       Neg leaking of urine  Musculoskeletal: Positive for gait problem. Negative for back pain.  Skin:       No bleeding or separation of incision  Neurological: Positive for weakness and numbness. Negative for headaches.    OBJECTIVE Patient Vitals for the past 24 hrs:  BP Temp Pulse Resp SpO2  02/28/18 1826 102/70 98.1 F (36.7 C) 99 18 100 %   Constitutional: Well-developed, well-nourished female in no acute distress.  Cardiovascular: normal rate Respiratory: normal rate and effort.  GI: Abd soft, mild tenderness above incision. Incision well-healed, intact. No bruising, swelling.  MS: Extremities nontender, no edema, normal ROM. Decrease strength on flexion of right foot and abduction of right leg. No calf pain, swelling, warmth or cords.  Neurologic: Alert and oriented x 4. Decreased sensation of light tough on anterior right foot.  GU: deferred  LAB RESULTS NA  IMAGING Mr Lumbar Spine Wo Contrast  Result Date: 02/04/2018 CLINICAL DATA:  Status post C-section delivery. Epidural anesthesia with postpartum myelopathy. Right lower extremity weakness. EXAM: MRI LUMBAR SPINE WITHOUT CONTRAST TECHNIQUE: Multiplanar, multisequence MR imaging of the lumbar spine was performed. No intravenous contrast was administered. COMPARISON:  None. FINDINGS: Segmentation: There are five lumbar type vertebral bodies. The last full intervertebral  disc space is labeled L5-S1. Alignment:  Normal Vertebrae:  Normal marrow signal.  No bone lesions or fracture. Conus medullaris and cauda equina: Conus extends to the T12-L1 level. Conus and cauda equina appear normal. No abnormal epidural or intrathecal fluid collections or hematoma are identified. Paraspinal and other soft tissues: Enlarged postpartum uterus is noted. Disc levels: L1-2: No significant findings. L2-3: No significant findings. L3-4: Slight annular bulge with mild left lateral recess encroachment. No foraminal stenosis. L4-5: Shallow broad-based disc protrusion asymmetric right. There is mild mass effect on the ventral thecal sac and although I do not see any definite direct compression of the right L4 nerve root it could be irritated. L5-S1: No significant findings. IMPRESSION: 1. Shallow disc  protrusion at L4-5 with mass effect on the ventral thecal sac and possible irritation of the right L4 nerve root. 2. No intraspinal fluid collections or hematoma. Electronically Signed   By: Rudie Meyer M.D.   On: 02/04/2018 11:55   Preliminary results os lower extremity dopplers Nml.   MAU COURSE Orders Placed This Encounter  Procedures  . Ambulatory referral to [facility]  . Discharge patient   Meds ordered this encounter  Medications  . gabapentin (NEURONTIN) capsule 600 mg  . gabapentin (NEURONTIN) 300 MG capsule    Sig: Take 1 capsule (300 mg total) by mouth 3 (three) times daily.    Dispense:  90 capsule    Refill:  3    Order Specific Question:   Supervising Provider    Answer:   Jaynie Collins A [[id number]]   Discussed Hx, labs, exam w/ Dr. Macon Large. Agrees w/ POC. New orders: Refer to [facility].    MDM - Ongoing pain and decreased sensation from bulging discs. No worsening of condition. Will change gabapentin for pain and refer pt to [facility] to start with since she has Pregnancy Medicaid. They can arrange further referrals as needed. Instructed to go  to ED for worsening Sx.   ASSESSMENT 1. Nerve pain   2. Bulging lumbar disc     PLAN Discharge home in stable condition. Nerve pain precautions Follow-up Information    [facility] Follow up.   Why:  call to schedule an appointment for your leg pain Contact information: [address] [place] [address] [phone]       [facility] EMERGENCY DEPARTMENT Follow up.   Specialty:  Emergency Medicine Why:  as needed for emergencies related to leg weakness or pain.  Contact information: [address] [id number] mc [place] [address] [phone]         Allergies as of 02/28/2018   No Known Allergies     Medication List    TAKE these medications   gabapentin 300 MG capsule Commonly known as:  NEURONTIN Take 1 capsule (300 mg total) by mouth 3 (three) times daily.   ibuprofen 600 MG tablet Commonly known as:  ADVIL,MOTRIN Take 1 tablet (600 mg total) by mouth every 6 (six) hours as needed for mild pain.   oxyCODONE-acetaminophen 5-325 MG tablet Commonly known as:  PERCOCET/ROXICET Take 1-2 tablets by mouth every 4 (four) hours as needed for moderate pain.   prenatal multivitamin Tabs tablet Take 1 tablet by mouth daily at 12 noon.   senna-docusate 8.6-50 MG tablet Commonly known as:  Senokot-S Take 2 tablets by mouth daily.        Katrinka Blazing, [place], [place] 02/28/2018  8:24 PM

## 2018-03-05 ENCOUNTER — Ambulatory Visit (HOSPITAL_COMMUNITY)
Admission: EM | Admit: 2018-03-05 | Discharge: 2018-03-05 | Disposition: A | Discharge status: Home / Self Care | Payer: Medicaid Other | Attending: Family Medicine | Admitting: Family Medicine

## 2018-03-05 ENCOUNTER — Encounter (HOSPITAL_COMMUNITY): Payer: Self-pay | Admitting: Emergency Medicine

## 2018-03-05 DIAGNOSIS — M5126 Other intervertebral disc displacement, lumbar region: Secondary | ICD-10-CM

## 2018-03-05 NOTE — Discharge Instructions (Signed)
To see spine specialist [facility] [address] ([address] [place] [address] [place]) 03/11/2018 AT 2:00 WITH DR POOLE ( NEED TO TAKE $200)  FOR FAMILY PRACTICE GO TO: [address]  (NEXT DOOR) YOU MUST FILL IN PAPERS THEY WILL GIVE YOU AN APPOINTMENT IN 2 WEEKS

## 2018-03-05 NOTE — ED Triage Notes (Addendum)
Pt delivered on 1/22, and ever since has been having right back and right leg pain.  Pt had epidural during delivery.  Pt has had MRI and states "there is something wrong with the cartilage".  States she is supposed to see a specialist, but has not been able to make an appointment in the next few weeks.  BP taken twice in triage.  Patient states she took pain medication prior to coming, unsure which one, but percocet prescribed.

## 2018-03-05 NOTE — ED Provider Notes (Signed)
[facility]    CSN: [id number] Arrival date & time: 03/05/18  1436     History   Chief Complaint Chief Complaint  Patient presents with  . Back Pain    HPI Elizabeth Brennan is a 38 y.o. female.   HPI  Patient is here for back pain that radiates down her right leg.  She delivered a baby on January 20 first/2020.  Postoperatively had back pain with leg pain.  She had an MRI performed of her back on1/23/2020 which showed shallow disc protrusion at L4-5 with mass-effect on the ventral thecal sac and possible irritation of the right L4 nerve root.  Because of unremitting pain she was readmitted on 02/28/2018.  She is currently taking gabapentin and Percocet.  She was discharged with the instructions to see her PCP for referral to spine specialty.  Patient does not have a PCP.  She has not been able to accomplish an appointment with the PCP.  She is not English-speaking.  She states that her Medicaid will run out and she does not qualify to see a back specialist.  She is uncertain where to turn.  Past Medical History:  Diagnosis Date  . Back pain   . Medical history non-contributory     Patient Active Problem List   Diagnosis Date Noted  . Post-dates pregnancy 02/02/2018    Past Surgical History:  Procedure Laterality Date  . APPENDECTOMY    . CESAREAN SECTION N/A 02/02/2018   Procedure: CESAREAN SECTION;  Surgeon: Levie Heritage, DO;  Location: [facility] BIRTHING SUITES;  Service: Obstetrics;  Laterality: N/A;    OB History    Gravida  5   Para  5   Term  5   Preterm      AB      Living  5     SAB      TAB      Ectopic      Multiple  0   Live Births  1            Home Medications    Prior to Admission medications   Medication Sig Start Date End Date Taking? Authorizing Provider  gabapentin (NEURONTIN) 300 MG capsule Take 1 capsule (300 mg total) by mouth 3 (three) times daily. 02/28/18   Katrinka Blazing, [place], CNM  ibuprofen (ADVIL,MOTRIN) 600 MG tablet Take  1 tablet (600 mg total) by mouth every 6 (six) hours as needed for mild pain. 02/05/18   Arvilla Market, DO  oxyCODONE-acetaminophen (PERCOCET/ROXICET) 5-325 MG tablet Take 1-2 tablets by mouth every 4 (four) hours as needed for moderate pain. 02/05/18   Arvilla Market, DO  Prenatal Vit-Fe Fumarate-FA (PRENATAL MULTIVITAMIN) TABS tablet Take 1 tablet by mouth daily at 12 noon.    [provider]  senna-docusate (SENOKOT-S) 8.6-50 MG tablet Take 2 tablets by mouth daily. 02/06/18   Arvilla Market, DO    Family History History reviewed. No pertinent family history.  Social History Social History   Tobacco Use  . Smoking status: Never Smoker  . Smokeless tobacco: Never Used  Substance Use Topics  . Alcohol use: No  . Drug use: No     Allergies   Patient has no known allergies.   Review of Systems Review of Systems  Musculoskeletal: Positive for back pain.     Physical Exam Triage Vital Signs ED Triage Vitals  Enc Vitals Group     BP 03/05/18 1530 92/70     Pulse Rate  03/05/18 1530 85     Resp 03/05/18 1530 18     Temp --      Temp src --      SpO2 03/05/18 1530 100 %     Weight --      Height --      Head Circumference --      Peak Flow --      Pain Score 03/05/18 1532 6     Pain Loc --      Pain Edu? --      Excl. in GC? --    No data found.  Updated Vital Signs BP 92/70 (BP Location: Right Arm)   Pulse 85   Resp 18   LMP 04/24/2017   SpO2 100%       Physical Exam Constitutional:      General: She is not in acute distress.    Appearance: She is well-developed.     Comments: Appears uncomfortable  HENT:     Head: Normocephalic and atraumatic.  Eyes:     Conjunctiva/sclera: Conjunctivae normal.     Pupils: Pupils are equal, round, and reactive to light.  Neck:     Musculoskeletal: Normal range of motion.  Cardiovascular:     Rate and Rhythm: Normal rate and regular rhythm.     Heart sounds: Normal heart  sounds.  Pulmonary:     Effort: Pulmonary effort is normal. No respiratory distress.     Breath sounds: Normal breath sounds.  Abdominal:     General: There is no distension.     Palpations: Abdomen is soft.     Tenderness: There is no abdominal tenderness.  Musculoskeletal: Normal range of motion.  Skin:    General: Skin is warm and dry.  Neurological:     Mental Status: She is alert.  Psychiatric:        Mood and Affect: Mood normal.        Behavior: Behavior normal.      UC Treatments / Results  Labs (all labs ordered are listed, but only abnormal results are displayed) Labs Reviewed - No data to display  EKG None  Radiology No results found.  Procedures Procedures (including critical care time)  Medications Ordered in UC Medications - No data to display  Initial Impression / Assessment and Plan / UC Course  I have reviewed the triage vital signs and the nursing notes.  Pertinent labs & imaging results that were available during my care of the patient were reviewed by me and considered in my medical decision making (see chart for details).     I offered to call and try to assist her in setting up appointment.  She has enough pain medicine.  Her pain is unchanged. Final Clinical Impressions(s) / UC Diagnoses   Final diagnoses:  HNP (herniated nucleus pulposus), lumbar     Discharge Instructions     To see spine specialist [facility] [address] ([address]) 03/11/2018 AT 2:00 WITH DR POOLE ( NEED TO TAKE $200)  FOR FAMILY PRACTICE GO TO: [address]  ([address]) YOU MUST FILL IN PAPERS THEY WILL GIVE YOU AN APPOINTMENT IN 2 WEEKS     ED Prescriptions    None     Controlled Substance Prescriptions  Controlled Substance Registry consulted? Not Applicable   Eustace Moore, MD 03/05/18 [phone]

## 2019-01-11 ENCOUNTER — Other Ambulatory Visit: Payer: Self-pay | Admitting: Pediatrics

## 2019-01-11 MED ORDER — PERMETHRIN 5 % EX CREA: 1.0000 "application " | TOPICAL_CREAM | Freq: Once | CUTANEOUS | 0 refills | Status: DC

## 2019-01-11 MED ORDER — PERMETHRIN 5 % EX CREA: 1.0000 "application " | TOPICAL_CREAM | Freq: Once | CUTANEOUS | 1 refills | Status: AC

## 2019-01-11 NOTE — Progress Notes (Signed)
Entire family is being treated for scabies.  Sent Rx for permethrin Kellie Simmering MD

## 2019-06-24 ENCOUNTER — Other Ambulatory Visit: Payer: Self-pay | Admitting: Student in an Organized Health Care Education/Training Program

## 2019-06-24 DIAGNOSIS — B86 Scabies: Secondary | ICD-10-CM

## 2019-06-24 MED ORDER — PERMETHRIN 5 % EX CREA: 1.0000 "application " | TOPICAL_CREAM | Freq: Once | CUTANEOUS | 0 refills | Status: AC

## 2019-06-24 NOTE — Progress Notes (Signed)
Household contact being seen today for scabies. Treating other family members.

## 2020-06-08 IMAGING — MR MR LUMBAR SPINE W/O CM
4 of 5 series · 19 of 48 positions shown · non-contrast
Comparison: None.

CLINICAL DATA: Status post C-section delivery. Epidural anesthesia
with postpartum myelopathy. Right lower extremity weakness.

EXAM:
MRI LUMBAR SPINE WITHOUT CONTRAST
TECHNIQUE: Multiplanar, multisequence MR imaging of the lumbar spine was
performed. No intravenous contrast was administered.

[Series 3: T1 · sagittal · 4.0mm · 0.51mm/px · 3 of 14 slices shown (1 of 2)]
[im 3/14]
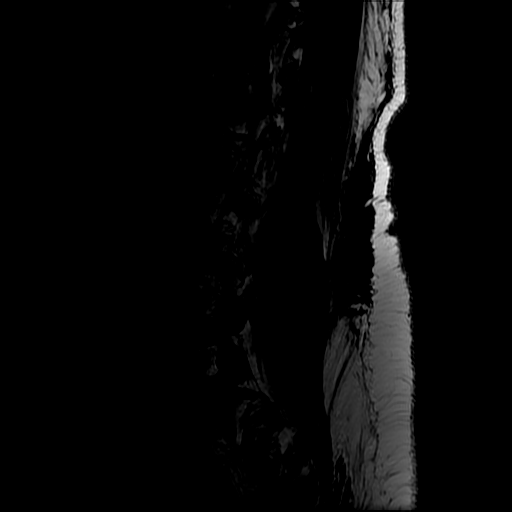
[im 8/14]
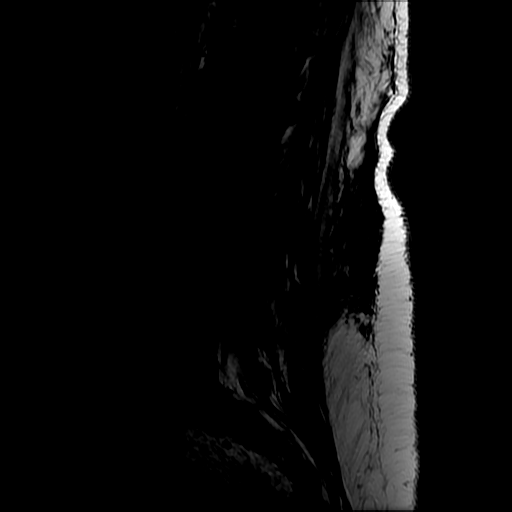
[im 14/14]
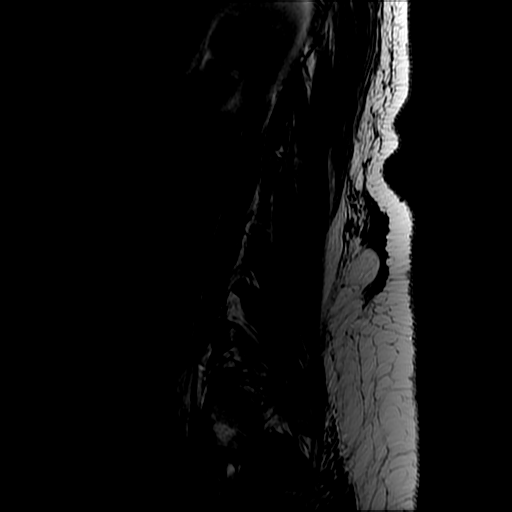

[Series 4: T2 post-contrast · sagittal · 4.0mm · 0.51mm/px · 6 of 14 slices shown]
[im 1/14]
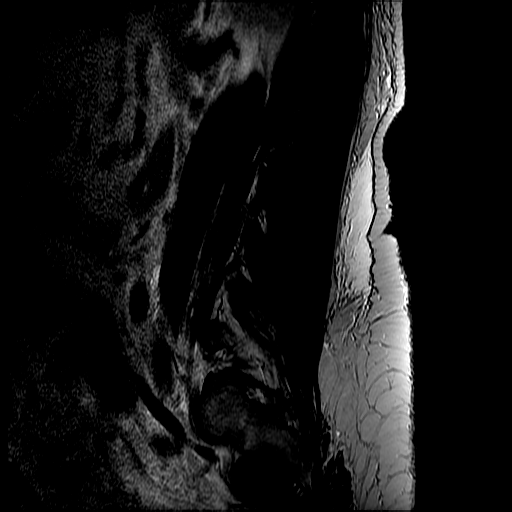
[im 3/14]
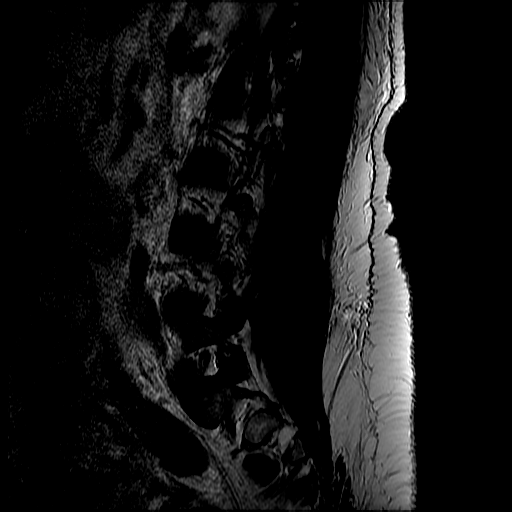
[im 6/14]
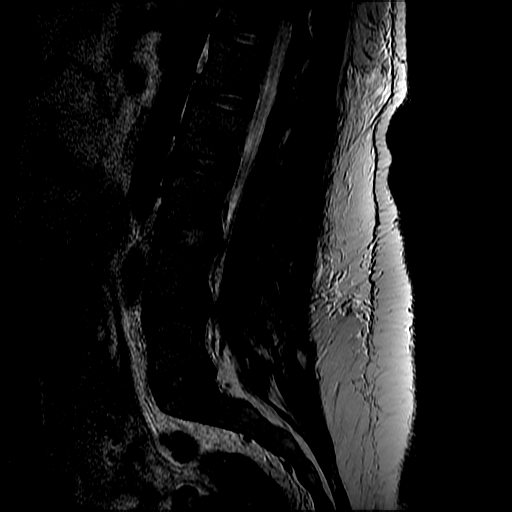
[im 8/14]
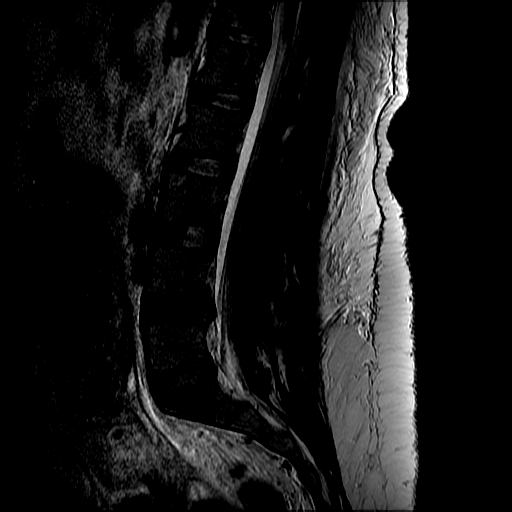
[im 11/14]
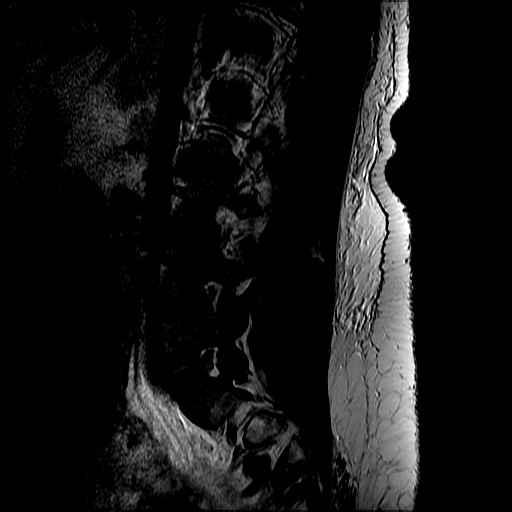
[im 14/14]
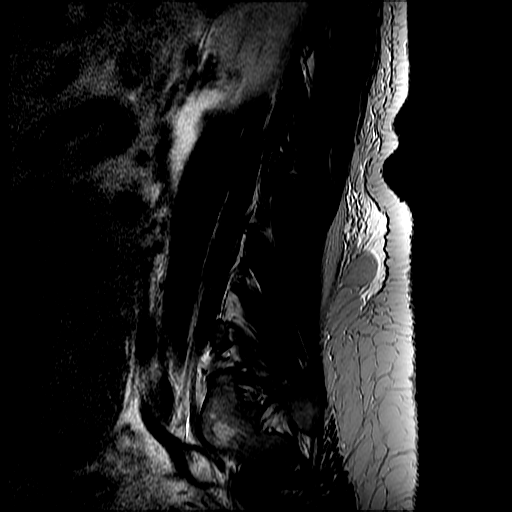

[Series 6: T2 · axial · 4.0mm · 0.41mm/px · z∈[-20,+143]mm · 7 of 34 slices shown]
[im 1/34]
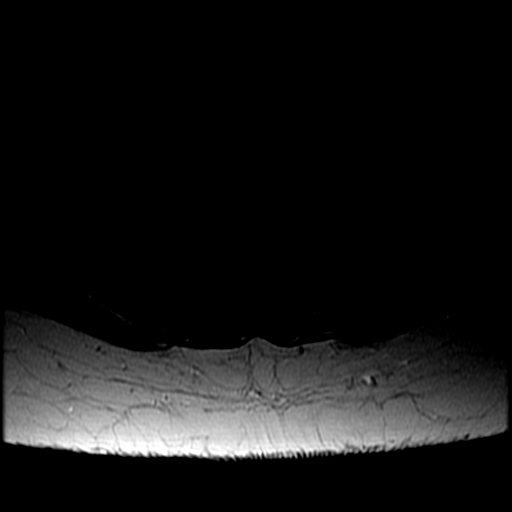
[im 5/34]
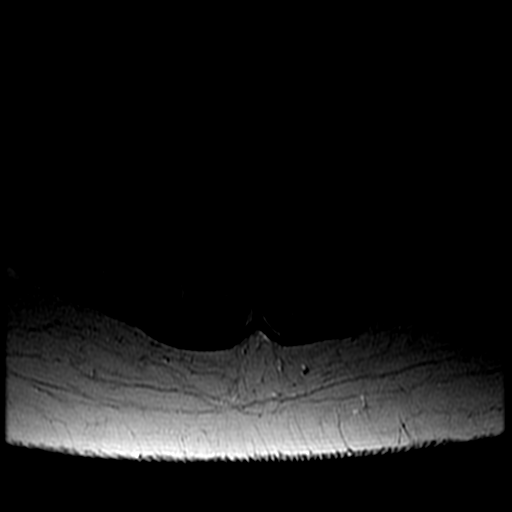
[im 10/34]
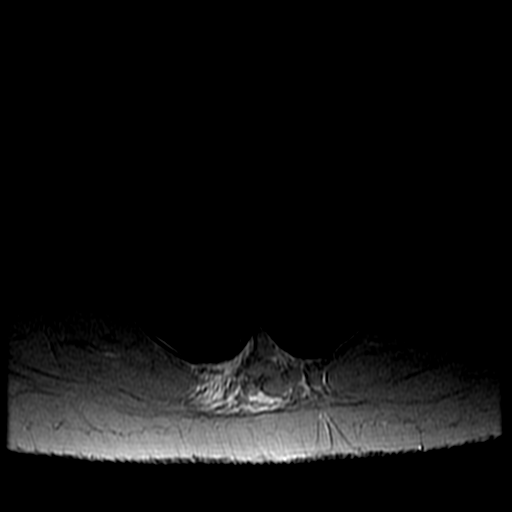
[im 15/34]
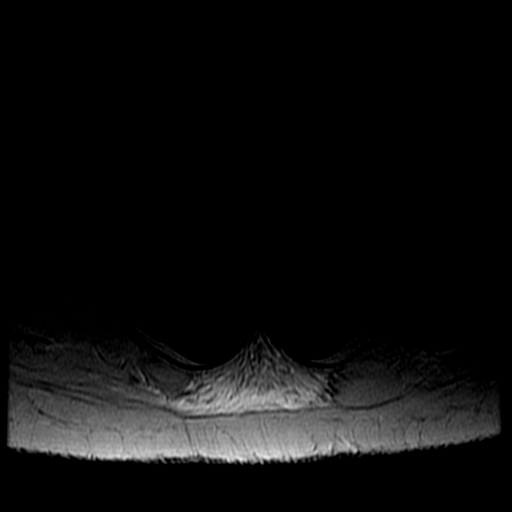
[im 17/34]
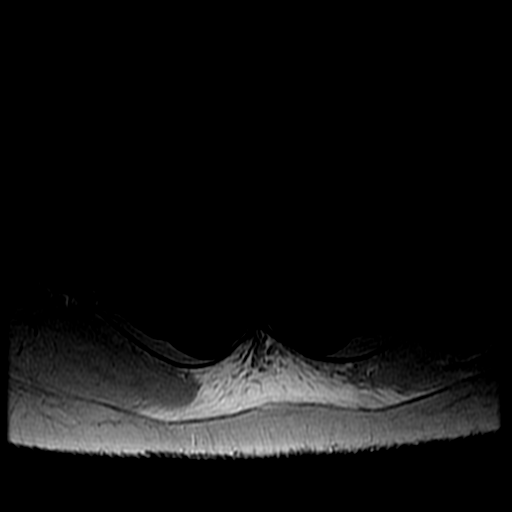
[im 19/34]
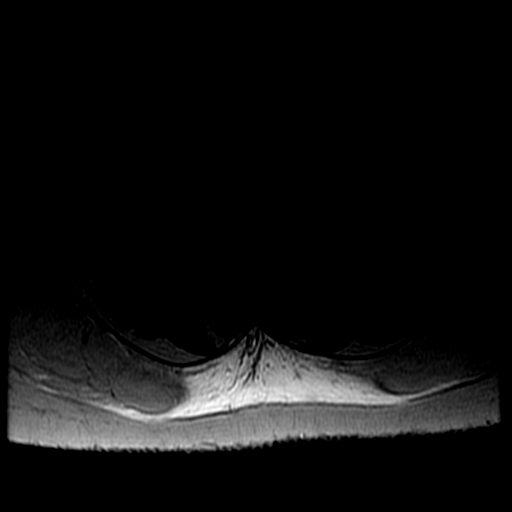
[im 29/34]
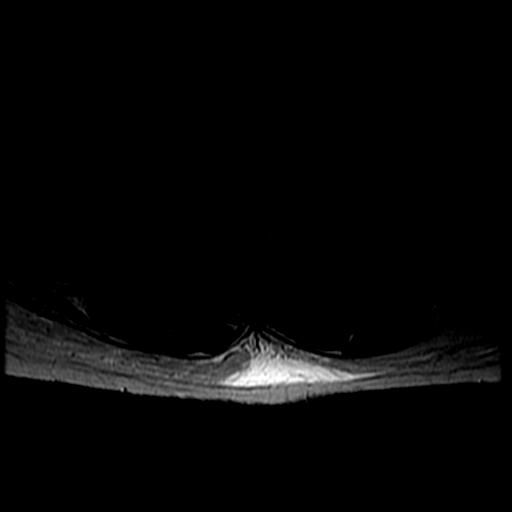

[Series 7: T1 · axial · 4.0mm · 0.41mm/px · z∈[-0,+143]mm · 3 of 34 slices shown (2 of 2)]
[im 5/34]
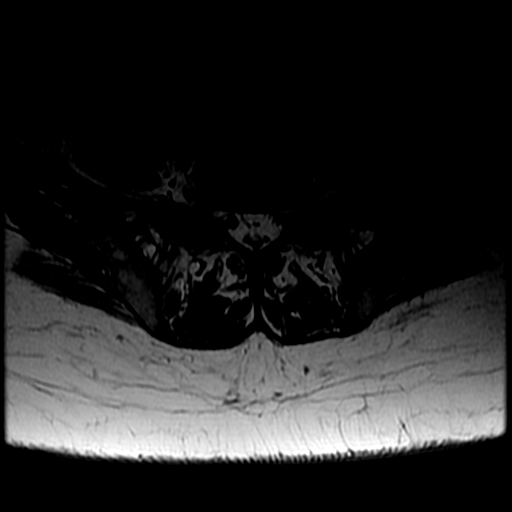
[im 17/34]
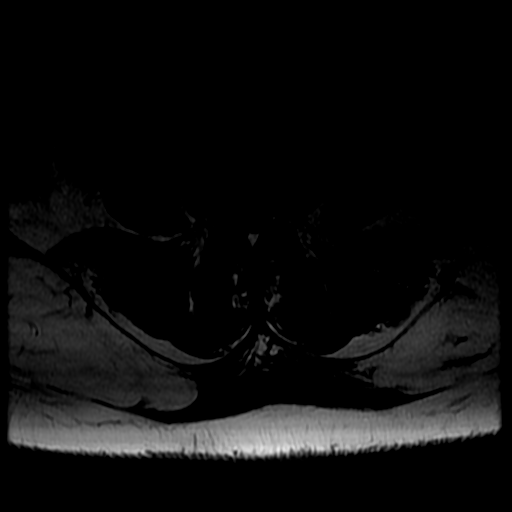
[im 29/34]
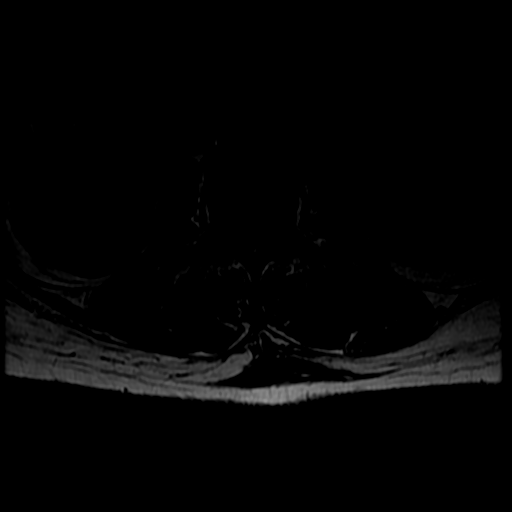

[19 of 48 positions shown; findings below may reference images not displayed]

FINDINGS: Segmentation: There are five lumbar type vertebral bodies. The last
full intervertebral disc space is labeled L5-S1.

Alignment:  Normal

Vertebrae:  Normal marrow signal.  No bone lesions or fracture.

Conus medullaris and cauda equina: Conus extends to the T12-L1
level. Conus and cauda equina appear normal. No abnormal epidural or
intrathecal fluid collections or hematoma are identified.

Paraspinal and other soft tissues: Enlarged postpartum uterus is
noted.

Disc levels:

L1-2: No significant findings.

L2-3: No significant findings.

L3-4: Slight annular bulge with mild left lateral recess
encroachment. No foraminal stenosis.

L4-5: Shallow broad-based disc protrusion asymmetric right. There is
mild mass effect on the ventral thecal sac and although I do not see
any definite direct compression of the right L4 nerve root it could
be irritated.

L5-S1: No significant findings.
IMPRESSION: 1. Shallow disc protrusion at L4-5 with mass effect on the ventral
thecal sac and possible irritation of the right L4 nerve root.
2. No intraspinal fluid collections or hematoma.

## 2021-01-28 ENCOUNTER — Other Ambulatory Visit: Payer: Self-pay | Admitting: Pediatrics

## 2021-01-28 DIAGNOSIS — B86 Scabies: Secondary | ICD-10-CM

## 2021-01-28 MED ORDER — PERMETHRIN 5 % EX CREA: 1.0000 "application " | TOPICAL_CREAM | Freq: Once | CUTANEOUS | 3 refills | Status: AC

## 2021-07-29 ENCOUNTER — Other Ambulatory Visit: Payer: Self-pay | Admitting: Neurosurgery

## 2021-07-29 DIAGNOSIS — M5412 Radiculopathy, cervical region: Secondary | ICD-10-CM

## 2021-07-29 DIAGNOSIS — M5416 Radiculopathy, lumbar region: Secondary | ICD-10-CM

## 2021-08-09 ENCOUNTER — Ambulatory Visit
Admission: RE | Admit: 2021-08-09 | Discharge: 2021-08-09 | Disposition: A | Discharge status: Home / Self Care | Payer: Medicaid Other | Source: Ambulatory Visit | Attending: Neurosurgery | Admitting: Neurosurgery

## 2021-08-09 DIAGNOSIS — M5416 Radiculopathy, lumbar region: Secondary | ICD-10-CM

## 2021-08-09 DIAGNOSIS — M5412 Radiculopathy, cervical region: Secondary | ICD-10-CM

## 2021-09-09 ENCOUNTER — Other Ambulatory Visit: Payer: Self-pay | Admitting: Neurosurgery

## 2021-10-24 NOTE — Pre-Procedure Instructions (Addendum)
Surgical Instructions    Your procedure is scheduled on Friday October 27.  Report to [facility] Main Entrance "A" at 6:00 A.M., then check in with the Admitting office.  Call this number if you have problems the morning of surgery:  [phone]  If you have any questions prior to your surgery date call [phone]: Open Monday-Friday 8am-4pm  If you experience any cold or flu symptoms such as cough, fever, chills, shortness of breath, etc. between now and your scheduled surgery, please notify us at the above number     Remember:  Do not eat after midnight the night before your surgery  You may drink clear liquids until 5:00 the morning of your surgery.   Clear liquids allowed are: Water, Non-Citrus Juices (without pulp), Carbonated Beverages, Clear Tea, Black Coffee ONLY (NO MILK, CREAM OR POWDERED CREAMER of any kind), and Gatorade    Take these medicines the morning of surgery with A SIP OF WATER:  gabapentin (NEURONTIN)  IF NEEDED: oxyCODONE-acetaminophen (PERCOCET/ROXICET)  As of today, STOP taking any Aspirin (unless otherwise instructed by your surgeon) Aleve, Naproxen, Ibuprofen, Motrin, Advil, Goody's, BC's, all herbal medications, fish oil, and all vitamins.          Do NOT Smoke (Tobacco/Vaping)  24 hours prior to your procedure  If you use a CPAP at night, you may bring your mask for your overnight stay.   Contacts, glasses, hearing aids, dentures or partials may not be worn into surgery, please bring cases for these belongings   For patients admitted to the hospital, discharge time will be determined by your treatment team.   Patients discharged the day of surgery will not be allowed to drive home, and someone needs to stay with them for 24 hours.   SURGICAL WAITING ROOM VISITATION Patients having surgery or a procedure may have no more than 2 support people in the waiting area - these visitors may rotate.   Children under the age of 27 must have an adult with  them who is not the patient. If the patient needs to stay at the hospital during part of their recovery, the visitor guidelines for inpatient rooms apply. Pre-op nurse will coordinate an appropriate time for 1 support person to accompany patient in pre-op.  This support person may not rotate.   Please refer to the [facility] website for the visitor guidelines for Inpatients (after your surgery is over and you are in a regular room).    Special instructions:    Oral Hygiene is also important to reduce your risk of infection.  Remember - BRUSH YOUR TEETH THE MORNING OF SURGERY WITH YOUR REGULAR TOOTHPASTE   [facility]- Preparing For Surgery  Before surgery, you can play an important role. Because skin is not sterile, your skin needs to be as free of germs as possible. You can reduce the number of germs on your skin by washing with CHG (chlorahexidine gluconate) Soap before surgery.  CHG is an antiseptic cleaner which kills germs and bonds with the skin to continue killing germs even after washing.     Please do not use if you have an allergy to CHG or antibacterial soaps. If your skin becomes reddened/irritated stop using the CHG.  Do not shave (including legs and underarms) for at least 48 hours prior to first CHG shower. It is OK to shave your face.  Please follow these instructions carefully.     Shower the NIGHT BEFORE SURGERY and the MORNING OF SURGERY with CHG  Soap.   If you chose to wash your hair, wash your hair first as usual with your normal shampoo. After you shampoo, rinse your hair and body thoroughly to remove the shampoo.  Then [employer] and genitals (private parts) with your normal soap and rinse thoroughly to remove soap.  After that Use CHG Soap as you would any other liquid soap. You can apply CHG directly to the skin and wash gently with a scrungie or a clean washcloth.   Apply the CHG Soap to your body ONLY FROM THE NECK DOWN.  Do not use on open wounds or open sores.  Avoid contact with your eyes, ears, mouth and genitals (private parts). Wash Face and genitals (private parts)  with your normal soap.   Wash thoroughly, paying special attention to the area where your surgery will be performed.  Thoroughly rinse your body with warm water from the neck down.  DO NOT shower/wash with your normal soap after using and rinsing off the CHG Soap.  Pat yourself dry with a CLEAN TOWEL.  Wear CLEAN PAJAMAS to bed the night before surgery  Place CLEAN SHEETS on your bed the night before your surgery  DO NOT SLEEP WITH PETS.   Day of Surgery:  Take a shower with CHG soap. Wear Clean/Comfortable clothing the morning of surgery Remember to brush your teeth WITH YOUR REGULAR TOOTHPASTE. Do not wear jewelry or makeup. Do not wear lotions, powders, perfumes or deodorant. Do not shave 48 hours prior to surgery.  Do not bring valuables to the hospital. [facility] is not responsible for any belongings or valuables.  Do not wear nail polish, gel polish, artificial nails, or any other type of covering on natural nails (fingers and toes) If you have artificial nails or gel coating that need to be removed by a nail salon, please have this removed prior to surgery. Artificial nails or gel coating may interfere with anesthesia's ability to adequately monitor your vital signs.    If you received a COVID test during your pre-op visit, it is requested that you wear a mask when out in public, stay away from anyone that may not be feeling well, and notify your surgeon if you develop symptoms. If you have been in contact with anyone that has tested positive in the last 10 days, please notify your surgeon.    Please read over the following fact sheets that you were given.

## 2021-10-25 ENCOUNTER — Other Ambulatory Visit: Payer: Self-pay

## 2021-10-25 ENCOUNTER — Encounter (HOSPITAL_COMMUNITY)
Admission: RE | Admit: 2021-10-25 | Discharge: 2021-10-25 | Disposition: A | Discharge status: Home / Self Care | Payer: Medicaid Other | Source: Ambulatory Visit | Attending: Neurosurgery | Admitting: Neurosurgery

## 2021-10-25 ENCOUNTER — Encounter (HOSPITAL_COMMUNITY): Payer: Self-pay

## 2021-10-25 VITALS — BP 117/86 | HR 114 | Temp 98.2°F | Resp 18 | Ht 68.0 in | Wt 211.7 lb

## 2021-10-25 DIAGNOSIS — Z01812 Encounter for preprocedural laboratory examination: Secondary | ICD-10-CM | POA: Insufficient documentation

## 2021-10-25 DIAGNOSIS — Z01818 Encounter for other preprocedural examination: Secondary | ICD-10-CM

## 2021-10-25 LAB — CBC
HCT: 41.3 % (ref 36.0–46.0)
Hemoglobin: 13.5 g/dL (ref 12.0–15.0)
MCH: 27.8 pg (ref 26.0–34.0)
MCHC: 32.7 g/dL (ref 30.0–36.0)
MCV: 85.2 fL (ref 80.0–100.0)
Platelets: 276 10*3/uL (ref 150–400)
RBC: 4.85 MIL/uL (ref 3.87–5.11)
RDW: 12.8 % (ref 11.5–15.5)
WBC: 6.6 10*3/uL (ref 4.0–10.5)
nRBC: 0 % (ref 0.0–0.2)

## 2021-10-25 LAB — SURGICAL PCR SCREEN

## 2021-10-25 NOTE — Progress Notes (Signed)
PCP - NO PCP pt reports  Cardiologist - denies  PPM/ICD - denies Device Orders - n/a Rep Notified - n/a  Chest x-ray - n/a EKG - denies  Stress Test - denies ECHO - denies Cardiac Cath - denies  Sleep Study - denies CPAP - n/a  DM-pt denies Blood Thinner Instructions:denies Aspirin Instructions:denies  COVID TEST- n/a   Anesthesia review: NO  Patient denies shortness of breath, fever, cough and chest pain at PAT appointment  Used video interpreter for PAT visit- #[id number].  All instructions explained to the patient, with a verbal understanding of the material. Patient agrees to go over the instructions while at home for a better understanding. The opportunity to ask questions was provided.

## 2021-10-25 NOTE — Pre-Procedure Instructions (Signed)
Surgical Instructions    Your procedure is scheduled on Friday October 27.  Report to [facility] Main Entrance "A" at 6:00 A.M., then check in with the Admitting office.  Call this number if you have problems the morning of surgery:  [phone]  If you have any questions prior to your surgery date call [phone]: Open Monday-Friday 8am-4pm  If you experience any cold or flu symptoms such as cough, fever, chills, shortness of breath, etc. between now and your scheduled surgery, please notify us at the above number     Remember:  Do not eat after midnight the night before your surgery  You may drink clear liquids until 5:00 the morning of your surgery.   Clear liquids allowed are: Water, Non-Citrus Juices (without pulp), Carbonated Beverages, Clear Tea, Black Coffee ONLY (NO MILK, CREAM OR POWDERED CREAMER of any kind), and Gatorade    Take these medicines the morning of surgery with A SIP OF WATER:   IF NEEDED: oxyCODONE-acetaminophen (PERCOCET/ROXICET)  As of today, STOP taking any Aspirin (unless otherwise instructed by your surgeon) Aleve, Naproxen, Ibuprofen, Motrin, Advil, Goody's, BC's, all herbal medications, fish oil, and all vitamins.          Do NOT Smoke (Tobacco/Vaping)  24 hours prior to your procedure  If you use a CPAP at night, you may bring your mask for your overnight stay.   Contacts, glasses, hearing aids, dentures or partials may not be worn into surgery, please bring cases for these belongings   For patients admitted to the hospital, discharge time will be determined by your treatment team.   Patients discharged the day of surgery will not be allowed to drive home, and someone needs to stay with them for 24 hours.   SURGICAL WAITING ROOM VISITATION Patients having surgery or a procedure may have no more than 2 support people in the waiting area - these visitors may rotate.   Children under the age of 63 must have an adult with them who is not the  patient. If the patient needs to stay at the hospital during part of their recovery, the visitor guidelines for inpatient rooms apply. Pre-op nurse will coordinate an appropriate time for 1 support person to accompany patient in pre-op.  This support person may not rotate.   Please refer to the [facility] website for the visitor guidelines for Inpatients (after your surgery is over and you are in a regular room).    Special instructions:    Oral Hygiene is also important to reduce your risk of infection.  Remember - BRUSH YOUR TEETH THE MORNING OF SURGERY WITH YOUR REGULAR TOOTHPASTE   [place]- Preparing For Surgery  Before surgery, you can play an important role. Because skin is not sterile, your skin needs to be as free of germs as possible. You can reduce the number of germs on your skin by washing with CHG (chlorahexidine gluconate) Soap before surgery.  CHG is an antiseptic cleaner which kills germs and bonds with the skin to continue killing germs even after washing.     Please do not use if you have an allergy to CHG or antibacterial soaps. If your skin becomes reddened/irritated stop using the CHG.  Do not shave (including legs and underarms) for at least 48 hours prior to first CHG shower. It is OK to shave your face.  Please follow these instructions carefully.     Shower the NIGHT BEFORE SURGERY and the MORNING OF SURGERY with CHG Soap.  If you chose to wash your hair, wash your hair first as usual with your normal shampoo. After you shampoo, rinse your hair and body thoroughly to remove the shampoo.  Then [employer] and genitals (private parts) with your normal soap and rinse thoroughly to remove soap.  After that Use CHG Soap as you would any other liquid soap. You can apply CHG directly to the skin and wash gently with a scrungie or a clean washcloth.   Apply the CHG Soap to your body ONLY FROM THE NECK DOWN.  Do not use on open wounds or open sores. Avoid contact with  your eyes, ears, mouth and genitals (private parts). Wash Face and genitals (private parts)  with your normal soap.   Wash thoroughly, paying special attention to the area where your surgery will be performed.  Thoroughly rinse your body with warm water from the neck down.  DO NOT shower/wash with your normal soap after using and rinsing off the CHG Soap.  Pat yourself dry with a CLEAN TOWEL.  Wear CLEAN PAJAMAS to bed the night before surgery  Place CLEAN SHEETS on your bed the night before your surgery  DO NOT SLEEP WITH PETS.   Day of Surgery:  Take a shower with CHG soap. Wear Clean/Comfortable clothing the morning of surgery Remember to brush your teeth WITH YOUR REGULAR TOOTHPASTE. Do not wear jewelry or makeup. Do not wear lotions, powders, perfumes or deodorant. Do not shave 48 hours prior to surgery.  Do not bring valuables to the hospital. [facility] is not responsible for any belongings or valuables.  Do not wear nail polish, gel polish, artificial nails, or any other type of covering on natural nails (fingers and toes) If you have artificial nails or gel coating that need to be removed by a nail salon, please have this removed prior to surgery. Artificial nails or gel coating may interfere with anesthesia's ability to adequately monitor your vital signs.    If you received a COVID test during your pre-op visit, it is requested that you wear a mask when out in public, stay away from anyone that may not be feeling well, and notify your surgeon if you develop symptoms. If you have been in contact with anyone that has tested positive in the last 10 days, please notify your surgeon.    Please read over the following fact sheets that you were given.

## 2021-10-25 NOTE — Pre-Procedure Instructions (Signed)
Surgical Instructions    Your procedure is scheduled on Friday October 27.  Report to [facility] Main Entrance "A" at 6:00 A.M., then check in with the Admitting office.  Call this number if you have problems the morning of surgery:  [phone]  If you have any questions prior to your surgery date call [phone]: Open Monday-Friday 8am-4pm  If you experience any cold or flu symptoms such as cough, fever, chills, shortness of breath, etc. between now and your scheduled surgery, please notify us at the above number     Remember:  Do not eat or drink after midnight the night before your surgery     Take these medicines the morning of surgery with A SIP OF WATER:   IF NEEDED: oxyCODONE-acetaminophen (PERCOCET/ROXICET)  As of today, STOP taking any Aspirin (unless otherwise instructed by your surgeon) Aleve, Naproxen, Ibuprofen, Motrin, Advil, Goody's, BC's, all herbal medications, fish oil, and all vitamins.          Do NOT Smoke (Tobacco/Vaping)  24 hours prior to your procedure  If you use a CPAP at night, you may bring your mask for your overnight stay.   Contacts, glasses, hearing aids, dentures or partials may not be worn into surgery, please bring cases for these belongings   For patients admitted to the hospital, discharge time will be determined by your treatment team.   Patients discharged the day of surgery will not be allowed to drive home, and someone needs to stay with them for 24 hours.   SURGICAL WAITING ROOM VISITATION Patients having surgery or a procedure may have no more than 2 support people in the waiting area - these visitors may rotate.   Children under the age of 53 must have an adult with them who is not the patient. If the patient needs to stay at the hospital during part of their recovery, the visitor guidelines for inpatient rooms apply. Pre-op nurse will coordinate an appropriate time for 1 support person to accompany patient in pre-op.  This  support person may not rotate.   Please refer to the [facility] website for the visitor guidelines for Inpatients (after your surgery is over and you are in a regular room).    Special instructions:    Oral Hygiene is also important to reduce your risk of infection.  Remember - BRUSH YOUR TEETH THE MORNING OF SURGERY WITH YOUR REGULAR TOOTHPASTE   [place]- Preparing For Surgery  Before surgery, you can play an important role. Because skin is not sterile, your skin needs to be as free of germs as possible. You can reduce the number of germs on your skin by washing with CHG (chlorahexidine gluconate) Soap before surgery.  CHG is an antiseptic cleaner which kills germs and bonds with the skin to continue killing germs even after washing.     Please do not use if you have an allergy to CHG or antibacterial soaps. If your skin becomes reddened/irritated stop using the CHG.  Do not shave (including legs and underarms) for at least 48 hours prior to first CHG shower. It is OK to shave your face.  Please follow these instructions carefully.     Shower the NIGHT BEFORE SURGERY and the MORNING OF SURGERY with CHG Soap.   If you chose to wash your hair, wash your hair first as usual with your normal shampoo. After you shampoo, rinse your hair and body thoroughly to remove the shampoo.  Then [employer] and genitals (private parts)  with your normal soap and rinse thoroughly to remove soap.  After that Use CHG Soap as you would any other liquid soap. You can apply CHG directly to the skin and wash gently with a scrungie or a clean washcloth.   Apply the CHG Soap to your body ONLY FROM THE NECK DOWN.  Do not use on open wounds or open sores. Avoid contact with your eyes, ears, mouth and genitals (private parts). Wash Face and genitals (private parts)  with your normal soap.   Wash thoroughly, paying special attention to the area where your surgery will be performed.  Thoroughly rinse your body with  warm water from the neck down.  DO NOT shower/wash with your normal soap after using and rinsing off the CHG Soap.  Pat yourself dry with a CLEAN TOWEL.  Wear CLEAN PAJAMAS to bed the night before surgery  Place CLEAN SHEETS on your bed the night before your surgery  DO NOT SLEEP WITH PETS.   Day of Surgery:  Take a shower with CHG soap. Wear Clean/Comfortable clothing the morning of surgery Remember to brush your teeth WITH YOUR REGULAR TOOTHPASTE. Do not wear jewelry or makeup. Do not wear lotions, powders, perfumes or deodorant. Do not shave 48 hours prior to surgery.  Do not bring valuables to the hospital. [facility] is not responsible for any belongings or valuables.  Do not wear nail polish, gel polish, artificial nails, or any other type of covering on natural nails (fingers and toes) If you have artificial nails or gel coating that need to be removed by a nail salon, please have this removed prior to surgery. Artificial nails or gel coating may interfere with anesthesia's ability to adequately monitor your vital signs.    If you received a COVID test during your pre-op visit, it is requested that you wear a mask when out in public, stay away from anyone that may not be feeling well, and notify your surgeon if you develop symptoms. If you have been in contact with anyone that has tested positive in the last 10 days, please notify your surgeon.    Please read over the following fact sheets that you were given.

## 2021-10-28 NOTE — Progress Notes (Signed)
Invalid surgical PCR at PAT appointment.  Will need to be repeated DOS.

## 2021-11-07 NOTE — Progress Notes (Signed)
Voice interpreter Marinus Maw ([place]) [phone] used to relay the surgery time change [phone], arrival 0715, and nothing to eat or drink after midnight unless she was told to take any medicine prior to arrival.

## 2021-11-07 NOTE — Anesthesia Preprocedure Evaluation (Addendum)
Anesthesia Evaluation  Patient identified by MRN, date of birth, ID band Patient awake    Reviewed: Allergy & Precautions, NPO status , Patient's Chart, lab work & pertinent test results  Airway Mallampati: III  TM Distance: >3 FB Neck ROM: Limited    Dental no notable dental hx.    Pulmonary neg pulmonary ROS   Pulmonary exam normal        Cardiovascular negative cardio ROS Normal cardiovascular exam     Neuro/Psych  negative psych ROS   GI/Hepatic negative GI ROS, Neg liver ROS,,,  Endo/Other  negative endocrine ROS    Renal/GU negative Renal ROS     Musculoskeletal Back pain   Abdominal  (+) + obese  Peds  Hematology negative hematology ROS (+)   Anesthesia Other Findings Cervical Stenosis  Reproductive/Obstetrics Hcg negative                             Anesthesia Physical Anesthesia Plan  ASA: 2  Anesthesia Plan: General   Post-op Pain Management:    Induction: Intravenous  PONV Risk Score and Plan: 3 and Ondansetron, Dexamethasone, Midazolam and Treatment may vary due to age or medical condition  Airway Management Planned: Oral ETT and Video Laryngoscope Planned  Additional Equipment:   Intra-op Plan:   Post-operative Plan: Extubation in OR  Informed Consent: I have reviewed the patients History and Physical, chart, labs and discussed the procedure including the risks, benefits and alternatives for the proposed anesthesia with the patient or authorized representative who has indicated his/her understanding and acceptance.     Interpreter used for [employer] Discussed with: CRNA  Anesthesia Plan Comments:         Anesthesia Quick Evaluation

## 2021-11-08 ENCOUNTER — Ambulatory Visit (HOSPITAL_BASED_OUTPATIENT_CLINIC_OR_DEPARTMENT_OTHER): Payer: Medicaid Other | Admitting: Anesthesiology

## 2021-11-08 ENCOUNTER — Encounter (HOSPITAL_COMMUNITY)
Admission: RE | Disposition: A | Discharge status: Home / Self Care | Payer: Self-pay | Source: Home / Self Care | Attending: Neurosurgery

## 2021-11-08 ENCOUNTER — Ambulatory Visit (HOSPITAL_COMMUNITY): Payer: Medicaid Other | Admitting: Vascular Surgery

## 2021-11-08 ENCOUNTER — Observation Stay (HOSPITAL_COMMUNITY)
Admission: RE | Admit: 2021-11-08 | Discharge: 2021-11-09 | Disposition: A | Discharge status: Home / Self Care | Payer: Medicaid Other | Attending: Neurosurgery | Admitting: Neurosurgery

## 2021-11-08 ENCOUNTER — Other Ambulatory Visit: Payer: Self-pay

## 2021-11-08 ENCOUNTER — Encounter (HOSPITAL_COMMUNITY): Payer: Self-pay | Admitting: Neurosurgery

## 2021-11-08 ENCOUNTER — Ambulatory Visit (HOSPITAL_COMMUNITY): Payer: Medicaid Other

## 2021-11-08 DIAGNOSIS — M50021 Cervical disc disorder at C4-C5 level with myelopathy: Secondary | ICD-10-CM | POA: Insufficient documentation

## 2021-11-08 DIAGNOSIS — M4712 Other spondylosis with myelopathy, cervical region: Secondary | ICD-10-CM

## 2021-11-08 DIAGNOSIS — M47892 Other spondylosis, cervical region: Secondary | ICD-10-CM | POA: Diagnosis present

## 2021-11-08 DIAGNOSIS — M4802 Spinal stenosis, cervical region: Secondary | ICD-10-CM

## 2021-11-08 DIAGNOSIS — M50022 Cervical disc disorder at C5-C6 level with myelopathy: Secondary | ICD-10-CM | POA: Diagnosis not present

## 2021-11-08 DIAGNOSIS — Z01818 Encounter for other preprocedural examination: Secondary | ICD-10-CM

## 2021-11-08 HISTORY — PX: ANTERIOR CERVICAL DECOMP/DISCECTOMY FUSION: SHX1161

## 2021-11-08 LAB — SURGICAL PCR SCREEN
MRSA, PCR: NEGATIVE
Staphylococcus aureus: NEGATIVE

## 2021-11-08 LAB — POCT PREGNANCY, URINE: Preg Test, Ur: NEGATIVE

## 2021-11-08 SURGERY — ANTERIOR CERVICAL DECOMPRESSION/DISCECTOMY FUSION 2 LEVELS
Anesthesia: General | Site: Neck

## 2021-11-08 MED ORDER — ONDANSETRON HCL 4 MG/2ML IJ SOLN: 4.0000 mg | Freq: Four times a day (QID) | INTRAMUSCULAR | Status: DC | PRN

## 2021-11-08 MED ORDER — MIDAZOLAM HCL 2 MG/2ML IJ SOLN
INTRAMUSCULAR | Status: AC
  Filled 2021-11-08: qty 2

## 2021-11-08 MED ORDER — THROMBIN 5000 UNITS EX SOLR: OROMUCOSAL | Status: DC | PRN

## 2021-11-08 MED ORDER — 0.9 % SODIUM CHLORIDE (POUR BTL) OPTIME
TOPICAL | Status: DC | PRN
  Administered 2021-11-08: 1000 mL

## 2021-11-08 MED ORDER — MIDAZOLAM HCL 5 MG/5ML IJ SOLN
INTRAMUSCULAR | Status: DC | PRN
  Administered 2021-11-08: 2 mg via INTRAVENOUS

## 2021-11-08 MED ORDER — HYDROMORPHONE HCL 1 MG/ML IJ SOLN: 1.0000 mg | INTRAMUSCULAR | Status: DC | PRN

## 2021-11-08 MED ORDER — LIDOCAINE 2% (20 MG/ML) 5 ML SYRINGE
INTRAMUSCULAR | Status: DC | PRN
  Administered 2021-11-08: 60 mg via INTRAVENOUS

## 2021-11-08 MED ORDER — ROCURONIUM BROMIDE 10 MG/ML (PF) SYRINGE
PREFILLED_SYRINGE | INTRAVENOUS | Status: DC | PRN
  Administered 2021-11-08: 50 mg via INTRAVENOUS

## 2021-11-08 MED ORDER — FENTANYL CITRATE (PF) 250 MCG/5ML IJ SOLN
INTRAMUSCULAR | Status: AC
  Filled 2021-11-08: qty 5

## 2021-11-08 MED ORDER — ACETAMINOPHEN 500 MG PO TABS
1000.0000 mg | ORAL_TABLET | Freq: Once | ORAL | Status: AC
  Administered 2021-11-08: 1000 mg via ORAL
  Filled 2021-11-08: qty 2

## 2021-11-08 MED ORDER — HYDROCODONE-ACETAMINOPHEN 5-325 MG PO TABS: 1.0000 | ORAL_TABLET | ORAL | Status: DC | PRN

## 2021-11-08 MED ORDER — AMISULPRIDE (ANTIEMETIC) 5 MG/2ML IV SOLN: 10.0000 mg | Freq: Once | INTRAVENOUS | Status: DC | PRN

## 2021-11-08 MED ORDER — PROPOFOL 10 MG/ML IV BOLUS
INTRAVENOUS | Status: DC | PRN
  Administered 2021-11-08: 150 mg via INTRAVENOUS

## 2021-11-08 MED ORDER — ACETAMINOPHEN 650 MG RE SUPP: 650.0000 mg | RECTAL | Status: DC | PRN

## 2021-11-08 MED ORDER — ONDANSETRON HCL 4 MG PO TABS: 4.0000 mg | ORAL_TABLET | Freq: Four times a day (QID) | ORAL | Status: DC | PRN

## 2021-11-08 MED ORDER — PHENOL 1.4 % MT LIQD: 1.0000 | OROMUCOSAL | Status: DC | PRN

## 2021-11-08 MED ORDER — CEFAZOLIN SODIUM-DEXTROSE 2-4 GM/100ML-% IV SOLN
2.0000 g | INTRAVENOUS | Status: AC
  Administered 2021-11-08: 2 g via INTRAVENOUS
  Filled 2021-11-08: qty 100

## 2021-11-08 MED ORDER — FENTANYL CITRATE (PF) 250 MCG/5ML IJ SOLN
INTRAMUSCULAR | Status: DC | PRN
  Administered 2021-11-08: 50 ug via INTRAVENOUS
  Administered 2021-11-08: 100 ug via INTRAVENOUS

## 2021-11-08 MED ORDER — SODIUM CHLORIDE 0.9% FLUSH
3.0000 mL | Freq: Two times a day (BID) | INTRAVENOUS | Status: DC
  Administered 2021-11-09: 3 mL via INTRAVENOUS

## 2021-11-08 MED ORDER — KETAMINE HCL 10 MG/ML IJ SOLN
INTRAMUSCULAR | Status: DC | PRN
  Administered 2021-11-08: 25 mg via INTRAVENOUS
  Administered 2021-11-08: 5 mg via INTRAVENOUS

## 2021-11-08 MED ORDER — SUGAMMADEX SODIUM 200 MG/2ML IV SOLN
INTRAVENOUS | Status: DC | PRN
  Administered 2021-11-08: 300 mg via INTRAVENOUS

## 2021-11-08 MED ORDER — OXYCODONE HCL 5 MG PO TABS: 5.0000 mg | ORAL_TABLET | Freq: Once | ORAL | Status: DC | PRN

## 2021-11-08 MED ORDER — DEXAMETHASONE SODIUM PHOSPHATE 10 MG/ML IJ SOLN
INTRAMUSCULAR | Status: DC | PRN
  Administered 2021-11-08: 10 mg via INTRAVENOUS

## 2021-11-08 MED ORDER — KETAMINE HCL 50 MG/5ML IJ SOSY
PREFILLED_SYRINGE | INTRAMUSCULAR | Status: AC
  Filled 2021-11-08: qty 5

## 2021-11-08 MED ORDER — SODIUM CHLORIDE 0.9 % IV SOLN
250.0000 mL | INTRAVENOUS | Status: DC
  Administered 2021-11-08: 250 mL via INTRAVENOUS

## 2021-11-08 MED ORDER — THROMBIN 5000 UNITS EX SOLR
CUTANEOUS | Status: DC | PRN
  Administered 2021-11-08 (×2): 5000 [IU] via TOPICAL

## 2021-11-08 MED ORDER — HYDROCODONE-ACETAMINOPHEN 10-325 MG PO TABS
1.0000 | ORAL_TABLET | ORAL | Status: DC | PRN
  Administered 2021-11-08: 1 via ORAL
  Administered 2021-11-08: 2 via ORAL
  Administered 2021-11-09 (×2): 1 via ORAL
  Filled 2021-11-08 (×3): qty 1
  Filled 2021-11-08: qty 2

## 2021-11-08 MED ORDER — SODIUM CHLORIDE 0.9% FLUSH: 3.0000 mL | INTRAVENOUS | Status: DC | PRN

## 2021-11-08 MED ORDER — ONDANSETRON HCL 4 MG/2ML IJ SOLN
INTRAMUSCULAR | Status: DC | PRN
  Administered 2021-11-08: 4 mg via INTRAVENOUS

## 2021-11-08 MED ORDER — PHENYLEPHRINE 80 MCG/ML (10ML) SYRINGE FOR IV PUSH (FOR BLOOD PRESSURE SUPPORT)
PREFILLED_SYRINGE | INTRAVENOUS | Status: DC | PRN
  Administered 2021-11-08 (×3): 80 ug via INTRAVENOUS

## 2021-11-08 MED ORDER — CHLORHEXIDINE GLUCONATE 0.12 % MT SOLN
15.0000 mL | Freq: Once | OROMUCOSAL | Status: AC
  Administered 2021-11-08: 15 mL via OROMUCOSAL
  Filled 2021-11-08: qty 15

## 2021-11-08 MED ORDER — OXYCODONE HCL 5 MG/5ML PO SOLN: 5.0000 mg | Freq: Once | ORAL | Status: DC | PRN

## 2021-11-08 MED ORDER — MENTHOL 3 MG MT LOZG: 1.0000 | LOZENGE | OROMUCOSAL | Status: DC | PRN

## 2021-11-08 MED ORDER — CHLORHEXIDINE GLUCONATE CLOTH 2 % EX PADS: 6.0000 | MEDICATED_PAD | Freq: Once | CUTANEOUS | Status: DC

## 2021-11-08 MED ORDER — LACTATED RINGERS IV SOLN: INTRAVENOUS | Status: DC

## 2021-11-08 MED ORDER — ORAL CARE MOUTH RINSE: 15.0000 mL | Freq: Once | OROMUCOSAL | Status: AC

## 2021-11-08 MED ORDER — FENTANYL CITRATE (PF) 100 MCG/2ML IJ SOLN
INTRAMUSCULAR | Status: AC
  Filled 2021-11-08: qty 2

## 2021-11-08 MED ORDER — PROMETHAZINE HCL 25 MG/ML IJ SOLN: 6.2500 mg | INTRAMUSCULAR | Status: DC | PRN

## 2021-11-08 MED ORDER — ACETAMINOPHEN 325 MG PO TABS: 650.0000 mg | ORAL_TABLET | ORAL | Status: DC | PRN

## 2021-11-08 MED ORDER — CYCLOBENZAPRINE HCL 10 MG PO TABS
10.0000 mg | ORAL_TABLET | Freq: Three times a day (TID) | ORAL | Status: DC | PRN
  Administered 2021-11-08 – 2021-11-09 (×3): 10 mg via ORAL
  Filled 2021-11-08 (×3): qty 1

## 2021-11-08 MED ORDER — CEFAZOLIN SODIUM-DEXTROSE 1-4 GM/50ML-% IV SOLN
1.0000 g | Freq: Three times a day (TID) | INTRAVENOUS | Status: AC
  Administered 2021-11-08 – 2021-11-09 (×2): 1 g via INTRAVENOUS
  Filled 2021-11-08 (×2): qty 50

## 2021-11-08 MED ORDER — FENTANYL CITRATE (PF) 100 MCG/2ML IJ SOLN
25.0000 ug | INTRAMUSCULAR | Status: DC | PRN
  Administered 2021-11-08 (×2): 50 ug via INTRAVENOUS

## 2021-11-08 MED ORDER — PROPOFOL 10 MG/ML IV BOLUS
INTRAVENOUS | Status: AC
  Filled 2021-11-08: qty 20

## 2021-11-08 MED ORDER — THROMBIN 5000 UNITS EX SOLR
CUTANEOUS | Status: AC
  Filled 2021-11-08: qty 10000

## 2021-11-08 SURGICAL SUPPLY — 48 items
BAG COUNTER SPONGE SURGICOUNT (BAG) ×1 IMPLANT
BAG DECANTER FOR FLEXI CONT (MISCELLANEOUS) ×1 IMPLANT
BAND RUBBER #18 3X1/16 STRL (MISCELLANEOUS) ×2 IMPLANT
BENZOIN TINCTURE PRP APPL 2/3 (GAUZE/BANDAGES/DRESSINGS) ×1 IMPLANT
BUR MATCHSTICK NEURO 3.0 LAGG (BURR) ×1 IMPLANT
CAGE PEEK 6X14X11 (Cage) ×2 IMPLANT
CANISTER SUCT 3000ML PPV (MISCELLANEOUS) ×1 IMPLANT
DRAPE C-ARM 42X72 X-RAY (DRAPES) ×2 IMPLANT
DRAPE LAPAROTOMY 100X72 PEDS (DRAPES) ×1 IMPLANT
DRAPE MICROSCOPE SLANT 54X150 (MISCELLANEOUS) ×1 IMPLANT
DURAPREP 6ML APPLICATOR 50/CS (WOUND CARE) ×1 IMPLANT
ELECT COATED BLADE 2.86 ST (ELECTRODE) ×1 IMPLANT
ELECT REM PT RETURN 9FT ADLT (ELECTROSURGICAL) ×1
ELECTRODE REM PT RTRN 9FT ADLT (ELECTROSURGICAL) ×1 IMPLANT
GAUZE 4X4 16PLY ~~LOC~~+RFID DBL (SPONGE) IMPLANT
GAUZE SPONGE 4X4 12PLY STRL (GAUZE/BANDAGES/DRESSINGS) ×1 IMPLANT
GLOVE ECLIPSE 9.0 STRL (GLOVE) ×1 IMPLANT
GLOVE EXAM NITRILE XL STR (GLOVE) IMPLANT
GOWN STRL REUS W/ TWL LRG LVL3 (GOWN DISPOSABLE) IMPLANT
GOWN STRL REUS W/ TWL XL LVL3 (GOWN DISPOSABLE) ×1 IMPLANT
GOWN STRL REUS W/TWL 2XL LVL3 (GOWN DISPOSABLE) IMPLANT
GOWN STRL REUS W/TWL LRG LVL3 (GOWN DISPOSABLE)
GOWN STRL REUS W/TWL XL LVL3 (GOWN DISPOSABLE) ×1
HALTER HD/CHIN CERV TRACTION D (MISCELLANEOUS) ×1 IMPLANT
HEMOSTAT POWDER KIT SURGIFOAM (HEMOSTASIS) IMPLANT
HEMOSTAT SURGICEL 2X14 (HEMOSTASIS) IMPLANT
KIT BASIN OR (CUSTOM PROCEDURE TRAY) ×1 IMPLANT
KIT TURNOVER KIT B (KITS) ×1 IMPLANT
NDL SPNL 20GX3.5 QUINCKE YW (NEEDLE) ×1 IMPLANT
NEEDLE SPNL 20GX3.5 QUINCKE YW (NEEDLE) ×1 IMPLANT
NS IRRIG 1000ML POUR BTL (IV SOLUTION) ×1 IMPLANT
PACK LAMINECTOMY NEURO (CUSTOM PROCEDURE TRAY) ×1 IMPLANT
PAD ARMBOARD 7.5X6 YLW CONV (MISCELLANEOUS) ×3 IMPLANT
PLATE VISION ELITE 40MM (Plate) IMPLANT
SCREW ST 13X4XST VA NS SPNE (Screw) IMPLANT
SCREW ST VAR 4 ATL (Screw) ×6 IMPLANT
SPACER SPNL 11X14X6XPEEK CVD (Cage) IMPLANT
SPCR SPNL 11X14X6XPEEK CVD (Cage) ×2 IMPLANT
SPONGE INTESTINAL PEANUT (DISPOSABLE) ×1 IMPLANT
SPONGE SURGIFOAM ABS GEL SZ50 (HEMOSTASIS) ×1 IMPLANT
STRIP CLOSURE SKIN 1/2X4 (GAUZE/BANDAGES/DRESSINGS) ×1 IMPLANT
SUT VIC AB 3-0 SH 8-18 (SUTURE) ×1 IMPLANT
SUT VIC AB 4-0 RB1 18 (SUTURE) ×1 IMPLANT
TAPE CLOTH 4X10 WHT NS (GAUZE/BANDAGES/DRESSINGS) ×1 IMPLANT
TOWEL GREEN STERILE (TOWEL DISPOSABLE) ×1 IMPLANT
TOWEL GREEN STERILE FF (TOWEL DISPOSABLE) ×1 IMPLANT
TRAP SPECIMEN MUCUS 40CC (MISCELLANEOUS) ×1 IMPLANT
WATER STERILE IRR 1000ML POUR (IV SOLUTION) ×1 IMPLANT

## 2021-11-08 NOTE — Progress Notes (Signed)
Orthopedic Tech Progress Note Patient Details:  Elizabeth Brennan Apr 16, 1980 [id number]  PACU RN called placing a verbal order for a soft collar. Collar was applied to the pt with assistance from the PACU RN.  Ortho Devices Type of Ortho Device: Soft collar Ortho Device/Splint Location: NECK Ortho Device/Splint Interventions: Ordered, Application, Adjustment   Post Interventions Patient Tolerated: Fair Instructions Provided: Adjustment of device, Care of device  Arville Go 11/08/2021, 12:35 PM

## 2021-11-08 NOTE — Op Note (Signed)
Date of procedure: 11/08/2021  Date of dictation: Same  Service: Neurosurgery  Preoperative diagnosis: Cervical spondylosis with stenosis and myelopathy  Postoperative diagnosis: Same  Procedure Name: C4-5, C5-6 anterior cervical discectomy with interbody fusion utilizing interbody cages, local harvested autograft, and anterior plate instrumentation  Surgeon:Lakeia Bradshaw A.Syanna Remmert, M.D.  Asst. Surgeon: Reinaldo Meeker, NP  Anesthesia: General  Indication: 41 year old female with progressive neck pain with radiating pain numbness and some weakness into her upper extremities bilaterally left much greater than right.  Work-up demonstrates evidence of significant cervical degenerative disc disease with associated spondylosis and stenosis with ongoing cord compression at C4-5 and C5-6.  Patient presents now for two-level anterior cervical discectomy and fusion in hopes of improving her symptoms.  Operative note: After induction of anesthesia, patient positioned supine with neck slightly extended and held in place with halter traction.  Patient's anterior cervical region prepped and draped sterilely.  Incision made overlying C5.  Dissection performed on the right.  Retractor placed.  Fluoroscopy used.  Levels confirmed.  Disc space was incised at C4-5 and C5-6.  Discectomies then performed using various instruments down to the level of the posterior annulus.  Microscope was then brought into the field used for microdissection of the spinal canal.  Remaining aspects of annulus and osteophytes removed using high-speed drill down to level of the posterior longitudinal ligament.  Posterior margin was elevated and resected in a piecemeal fashion.  A wide central decompression was then performed undercutting the bodies of C4 and C5.  Decompression then proceeded each neural foramina with wide anterior foraminotomies performed on the course exiting C5 nerve roots bilaterally.  At this point a very thorough decompression of  been achieved.  There was no evidence of injury to the thecal sac or nerve roots.  Procedures then repeated at C5-6 again without complications.  Wound was then irrigated.  Gelfoam was placed topically for hemostasis then removed.  [employer] anatomic peek cages were then packed with locally harvested autograft.  Each cage was then impacted into place and recessed slightly from the anterior cortical margin at C4-5 and C5-6.  Atlantis anterior cervical plate was then placed over the C4, C5 and C6 levels.  This then attached under fluoroscopic guidance using 13 mm variable angle screws to each at all 3 levels.  All screws given final tightening found to be solidly within the bone.  Locking screws engaged.  Final images reveal good position of the cages and the hardware at the proper upper level with normal alignment of the spine.  Wound was then irrigated.  Hemostasis was assured.  Wounds then closed in layers with Vicryl sutures.  Steri-Strips and sterile dressing were applied.  No apparent complications.  Patient tolerated the procedure well and she returns to the recovery room postop.

## 2021-11-08 NOTE — Brief Op Note (Signed)
11/08/2021  12:00 PM  PATIENT:  Barnie Del  41 y.o. female  PRE-OPERATIVE DIAGNOSIS:  Stenosis  POST-OPERATIVE DIAGNOSIS:  Stenosis  PROCEDURE:  Procedure(s) with comments: ACDF - C4-C5 - C5-C6 (N/A) - 3C  SURGEON:  Surgeon(s) and Role:    * Earnie Larsson, MD - Primary  PHYSICIAN ASSISTANT:   ASSISTANTSMearl Latin   ANESTHESIA:   general  EBL:  100 mL   BLOOD ADMINISTERED:none  DRAINS: none   LOCAL MEDICATIONS USED:  NONE  SPECIMEN:  No Specimen  DISPOSITION OF SPECIMEN:  N/A  COUNTS:  YES  TOURNIQUET:  * No tourniquets in log *  DICTATION: .Dragon Dictation  PLAN OF CARE: Admit for overnight observation  PATIENT DISPOSITION:  PACU - hemodynamically stable.   Delay start of Pharmacological VTE agent (>24hrs) due to surgical blood loss or risk of bleeding: yes

## 2021-11-08 NOTE — Transfer of Care (Signed)
Immediate Anesthesia Transfer of Care Note  Patient: Elizabeth Brennan  Procedure(s) Performed: ACDF - C4-C5 - C5-C6 (Neck)  Patient Location: PACU  Anesthesia Type:General  Level of Consciousness: drowsy  Airway & Oxygen Therapy: Patient Spontanous Breathing  Post-op Assessment: Report given to RN and Post -op Vital signs reviewed and stable  Post vital signs: Reviewed and stable  Last Vitals:  Vitals Value Taken Time  BP 119/86 11/08/21 1215  Temp    Pulse 102 11/08/21 1216  Resp 21 11/08/21 1216  SpO2 96 % 11/08/21 1216  Vitals shown include unvalidated device data.  Last Pain:  Vitals:   11/08/21 0745  TempSrc:   PainSc: 10-Worst pain ever      Patients Stated Pain Goal: 0 (01/21/30 3557)  Complications: No notable events documented.

## 2021-11-08 NOTE — Anesthesia Postprocedure Evaluation (Signed)
Anesthesia Post Note  Patient: Preethi Amadi  Procedure(s) Performed: ACDF - C4-C5 - C5-C6 (Neck)     Patient location during evaluation: PACU Anesthesia Type: General Level of consciousness: awake Pain management: pain level controlled Vital Signs Assessment: post-procedure vital signs reviewed and stable Respiratory status: spontaneous breathing, nonlabored ventilation, respiratory function stable and patient connected to nasal cannula oxygen Cardiovascular status: blood pressure returned to baseline and stable Postop Assessment: no apparent nausea or vomiting Anesthetic complications: no   No notable events documented.  Last Vitals:  Vitals:   11/08/21 1311 11/08/21 1329  BP: 116/81 122/79  Pulse: 92 87  Resp: 20 20  Temp: 36.6 C 36.6 C  SpO2: 94% 98%    Last Pain:  Vitals:   11/08/21 1345  TempSrc:   PainSc: 10-Worst pain ever                 Yotam Rhine P Bayard More

## 2021-11-08 NOTE — H&P (Signed)
Elizabeth Brennan is an 41 y.o. female.   Chief Complaint: Neck pain HPI: 41 year old female with neck pain with radiating pain numbness and some weakness in her left upper extremity.  Work-up demonstrates evidence of significant cervical spondylosis with associated disc protrusions causing marked cervical stenosis and cord compression at C4-5 and C5-6.  Patient presents now for two-level anterior cervical decompression and fusion in hopes of improving her symptoms.  Past Medical History:  Diagnosis Date   Back pain    Medical history non-contributory     Past Surgical History:  Procedure Laterality Date   APPENDECTOMY     CESAREAN SECTION N/A 02/02/2018   Procedure: CESAREAN SECTION;  Surgeon: Levie Heritage, DO;  Location: [facility];  Service: Obstetrics;  Laterality: N/A;    History reviewed. No pertinent family history. Social History:  reports that she has never smoked. She has never used smokeless tobacco. She reports that she does not drink alcohol and does not use drugs.  Allergies:  Allergies  Allergen Reactions   Pork-Derived Products Other (See Comments)    Cultural value     Medications Prior to Admission  Medication Sig Dispense Refill   ibuprofen (ADVIL) 200 MG tablet Take 400 mg by mouth every 6 (six) hours as needed for moderate pain or headache.     gabapentin (NEURONTIN) 300 MG capsule Take 1 capsule (300 mg total) by mouth 3 (three) times daily. (Patient not taking: Reported on 10/25/2021) 90 capsule 3   ibuprofen (ADVIL,MOTRIN) 600 MG tablet Take 1 tablet (600 mg total) by mouth every 6 (six) hours as needed for mild pain. (Patient not taking: Reported on 10/25/2021) 60 tablet 1   oxyCODONE-acetaminophen (PERCOCET/ROXICET) 5-325 MG tablet Take 1-2 tablets by mouth every 4 (four) hours as needed for moderate pain. (Patient not taking: Reported on 10/25/2021) 30 tablet 0   Prenatal Vit-Fe Fumarate-FA (PRENATAL MULTIVITAMIN) TABS tablet Take 1 tablet by mouth  daily at 12 noon. (Patient not taking: Reported on 10/25/2021)     senna-docusate (SENOKOT-S) 8.6-50 MG tablet Take 2 tablets by mouth daily. (Patient not taking: Reported on 10/25/2021) 20 tablet 0    Results for orders placed or performed during the hospital encounter of 11/08/21 (from the past 48 hour(s))  Pregnancy, urine POC     Status: None   Collection Time: 11/08/21  7:52 AM  Result Value Ref Range   Preg Test, Ur NEGATIVE NEGATIVE    Comment:        THE SENSITIVITY OF THIS METHODOLOGY IS >24 mIU/mL    No results found.  A comprehensive review of systems was negative.  Blood pressure 127/81, pulse 90, temperature 98.4 F (36.9 C), temperature source Oral, resp. rate 18, height 5\' 8"  (1.727 m), weight 96 kg, last menstrual period 07/27/2021, SpO2 98 %.  Patient is awake and alert.  She is oriented and appropriate.  Speech is fluent.  Judgment insight are intact.  Cranial nerve function normal bilaterally.  Motor examination with some mild weakness of her left wrist extensors and left brachioradialis otherwise motor strength intact.  Sensory examination with decrease sensation to pinprick and light touch in her left C6 dermatome.  Deep tendon flexes normal active.  No evidence of long track signs.  Gait and posture normal peer examination of head ears eyes nose and throat is unremarkable her chest and abdomen are benign.  Extremities are free from injury or deformity. Assessment/Plan C4-5, C5-6 spondylosis with stenosis and myelopathy.  Plan C4-5, C5-6 anterior cervical  discectomy with interbody fusion utilizing interbody cages, local harvested autograft, and anterior plate instrumentation.  Risks and benefits been explained.  Patient wishes to proceed.  Cooper Render Trenesha Alcaide 11/08/2021, 9:42 AM

## 2021-11-08 NOTE — Anesthesia Procedure Notes (Signed)
Procedure Name: Intubation Date/Time: 11/08/2021 10:31 AM  Performed by: Georgia Duff, CRNAPre-anesthesia Checklist: Patient identified, Emergency Drugs available, Suction available and Patient being monitored Patient Re-evaluated:Patient Re-evaluated prior to induction Oxygen Delivery Method: Circle System Utilized Preoxygenation: Pre-oxygenation with 100% oxygen Induction Type: IV induction Ventilation: Mask ventilation without difficulty Laryngoscope Size: Glidescope and 3 Grade View: Grade I Tube type: Oral Tube size: 7.0 mm Number of attempts: 1 Airway Equipment and Method: Stylet and Oral airway Placement Confirmation: ETT inserted through vocal cords under direct vision, positive ETCO2 and breath sounds checked- equal and bilateral Secured at: 21 cm Tube secured with: Tape Dental Injury: Teeth and Oropharynx as per pre-operative assessment

## 2021-11-09 DIAGNOSIS — M47892 Other spondylosis, cervical region: Secondary | ICD-10-CM | POA: Diagnosis not present

## 2021-11-09 MED ORDER — HYDROCODONE-ACETAMINOPHEN 5-325 MG PO TABS: 1.0000 | ORAL_TABLET | ORAL | 0 refills | Status: DC | PRN

## 2021-11-09 MED ORDER — CYCLOBENZAPRINE HCL 10 MG PO TABS: 10.0000 mg | ORAL_TABLET | Freq: Three times a day (TID) | ORAL | 0 refills | Status: DC | PRN

## 2021-11-09 NOTE — Progress Notes (Addendum)
Patient and oriented pt void surgical site clean and dry no signs of infection. D/c instructions explain and copy given to the patient and husband all questions answered patient and husband verbalized understanding. Will d/c patient home per order.

## 2021-11-09 NOTE — Progress Notes (Signed)
Neurosurgery Service Progress Note  Subjective: No acute events overnight, no new complaints   Objective: Vitals:   11/08/21 1712 11/08/21 2051 11/09/21 0004 11/09/21 0336  BP: 127/69 98/76 110/75 107/79  Pulse: 72 84 78 87  Resp: 20 18 20 18   Temp: 98.1 F (36.7 C) 98.4 F (36.9 C) 99 F (37.2 C) 98.2 F (36.8 C)  TempSrc: Oral Oral Oral Oral  SpO2: 96% 94% 94% 98%  Weight:      Height:        Physical Exam: Strength at least 4+/5 x4, incision c/d/I, neck soft  Assessment & Plan: 41 y.o. woman s/p C4-5/5-6 ACDF, recovering well.  -discharge home today  Judith Part  11/09/21 6:54 AM

## 2021-11-09 NOTE — Evaluation (Signed)
Occupational Therapy Evaluation Patient Details Name: Elizabeth Brennan MRN: [id number] DOB: 1980-10-29 Today's Date: 11/09/2021   History of Present Illness Pt is a 41 y.o female s/p ACDF C4-6. PMH significant for back pain.   Clinical Impression   PTA, pt lived with her family and was independent in ADL and IADL. Pt performing UB ADL and LB ADL with supervision for use of compensatory techniques and for safety. Pt educated and demonstrating use of compensatory techniques for grooming, bed mobility, toileting, LB dressing, UB dressing, and tub/shower transfer within precautions. All education reviewed, all questions answered. Recommending discharge home with no OT follow up. Re-consult if change in status.      Recommendations for follow up therapy are one component of a multi-disciplinary discharge planning process, led by the attending physician.  Recommendations may be updated based on patient status, additional functional criteria and insurance authorization.   Follow Up Recommendations  No OT follow up    Assistance Recommended at Discharge Intermittent Supervision/Assistance  Patient can return home with the following A little help with walking and/or transfers;A little help with bathing/dressing/bathroom;Assistance with cooking/housework;Assist for transportation;Help with stairs or ramp for entrance    Functional Status Assessment  Patient has had a recent decline in their functional status and demonstrates the ability to make significant improvements in function in a reasonable and predictable amount of time.  Equipment Recommendations  None recommended by OT (Pt not interested)    Recommendations for Other Services       Precautions / Restrictions Precautions Precautions: Cervical Precaution Booklet Issued: Yes (comment) Precaution Comments: All education reviewed within the context of ADL Required Braces or Orthoses: Cervical Brace Cervical Brace: Soft  collar Restrictions Weight Bearing Restrictions: No      Mobility Bed Mobility Overal bed mobility: Modified Independent             General bed mobility comments: Educating on use of log roll techique    Transfers Overall transfer level: Needs assistance Equipment used: None Transfers: Sit to/from Stand Sit to Stand: Supervision           General transfer comment: for safety      Balance Overall balance assessment: Mild deficits observed, not formally tested                                         ADL either performed or assessed with clinical judgement   ADL Overall ADL's : Needs assistance/impaired Eating/Feeding: Independent   Grooming: Supervision/safety;Standing   Upper Body Bathing: Standing;Supervision/ safety   Lower Body Bathing: Supervison/ safety;Sit to/from stand   Upper Body Dressing : Set up;Sitting Upper Body Dressing Details (indicate cue type and reason): set-up after initial education Lower Body Dressing: Supervision/safety;Sit to/from stand   Toilet Transfer: Supervision/safety;Ambulation;Regular Toilet   Toileting- Architect and Hygiene: Supervision/safety;Sit to/from stand   Tub/ Shower Transfer: Supervision/safety;Ambulation;Tub transfer Tub/Shower Transfer Details (indicate cue type and reason): Performing with supervision. Functional mobility during ADLs: Supervision/safety;Min guard General ADL Comments: Min guard A for longer distance mobility. Pt observed with min furniture surfing.     Vision Baseline Vision/History: 0 No visual deficits Ability to See in Adequate Light: 0 Adequate Patient Visual Report: No change from baseline Vision Assessment?: No apparent visual deficits Additional Comments: Interactively reviewing handout     Perception     Praxis      Pertinent Vitals/Pain Pain Assessment Pain Assessment:  Faces Faces Pain Scale: Hurts little more Pain Location: throat with  drinking/eating Pain Descriptors / Indicators: Sore Pain Intervention(s): Limited activity within patient's tolerance, Monitored during session     Hand Dominance     Extremity/Trunk Assessment Upper Extremity Assessment Upper Extremity Assessment: Overall WFL for tasks assessed   Lower Extremity Assessment Lower Extremity Assessment: Overall WFL for tasks assessed   Cervical / Trunk Assessment Cervical / Trunk Assessment: Neck Surgery   Communication Communication Communication: No difficulties;Prefers language other than English (Arabic, but [place] WFL for education provided)   Cognition Arousal/Alertness: Awake/alert Behavior During Therapy: WFL for tasks assessed/performed Overall Cognitive Status: Within Functional Limits for tasks assessed                                 General Comments: 1-2 cues to maintain precautions     General Comments  VSS. Husband present and supportive    Exercises     Shoulder Instructions      Home Living Family/patient expects to be discharged to:: Private residence Living Arrangements: Spouse/significant other;Children Available Help at Discharge: Family;Available 24 hours/day Type of Home: Apartment Home Access: Level entry     Home Layout: One level     Bathroom Shower/Tub: Tub/shower unit         Home Equipment: None   Additional Comments: not interested in [facility]      Prior Functioning/Environment Prior Level of Function : Independent/Modified Independent             Mobility Comments: no AD ADLs Comments: Independent in ADL and IADL        OT Problem List: Decreased activity tolerance;Decreased strength;Impaired balance (sitting and/or standing);Decreased knowledge of precautions;Pain      OT Treatment/Interventions:      OT Goals(Current goals can be found in the care plan section) Acute Rehab OT Goals Patient Stated Goal: No throat pain OT Goal Formulation: With patient  OT Frequency:       Co-evaluation              AM-PAC OT "6 Clicks" Daily Activity     Outcome Measure Help from another person eating meals?: None Help from another person taking care of personal grooming?: A Little Help from another person toileting, which includes using toliet, bedpan, or urinal?: A Little Help from another person bathing (including washing, rinsing, drying)?: A Little Help from another person to put on and taking off regular upper body clothing?: A Little Help from another person to put on and taking off regular lower body clothing?: A Little 6 Click Score: 19   End of Session Equipment Utilized During Treatment: Cervical collar Nurse Communication: Mobility status  Activity Tolerance: Patient tolerated treatment well Patient left: in bed;with call bell/phone within reach;with family/visitor present  OT Visit Diagnosis: Unsteadiness on feet (R26.81);Muscle weakness (generalized) (M62.81);Pain Pain - part of body:  (operative site/throat)                Time: 7588-3254 OT Time Calculation (min): 19 min Charges:  OT General Charges $OT Visit: 1 Visit OT Evaluation $OT Eval Low Complexity: [place], OTR/L [facility] Acute Rehabilitation Office: [phone]   Lula Olszewski 11/09/2021, 10:25 AM

## 2021-11-09 NOTE — Plan of Care (Signed)

## 2021-11-09 NOTE — Discharge Summary (Signed)
Discharge Summary  Date of Admission: 11/08/2021  Date of Discharge: 11/09/21  Attending Physician: Earnie Larsson, MD  Hospital Course: Patient was admitted following an uncomplicated Z0-0/9-2 ACDF. They were recovered in PACU and transferred to [facility]. Their preop symptoms were improved, their hospital course was uncomplicated and the patient was discharged home on 11/09/21. They will follow up in clinic with me in clinic in 2 weeks.  Neurologic exam at discharge:  Strength at least 4+/5 x4, incision c/d/I, neck soft  Discharge diagnosis: Cervical myelopathy, cervical radiculopathy  Judith Part, MD 11/09/21 6:55 AM

## 2021-11-09 NOTE — Discharge Instructions (Signed)
Wound Care Keep incision area dry.  You may remove outer bandage after 3 days and shower.   If you shower prior cover incision with plastic wrap.  Do not put any creams, lotions, or ointments on incision. Leave steri-strips on neck.  They will fall off by themselves. Activity Walk each and every day, increasing distance each day. No lifting greater than 5 lbs.  Avoid excessive neck motion. No driving for 2 weeks; may ride as a passenger locally. Wear neck brace at all times except when showering. Diet Resume your normal diet.  Return to Work Will be discussed at you follow up appointment. Call Your Doctor If Any of These Occur Redness, drainage, or swelling at the wound.  Temperature greater than 101 degrees. Severe pain not relieved by pain medication. Increased difficulty swallowing.  Incision starts to come apart. Follow Up Appt Call  [phone])  for problems.  If you have any hardware placed in your spine, you will need an x-ray before your appointment.  

## 2021-11-11 ENCOUNTER — Encounter (HOSPITAL_COMMUNITY): Payer: Self-pay | Admitting: Neurosurgery

## 2022-03-11 ENCOUNTER — Other Ambulatory Visit: Payer: Self-pay | Admitting: Neurosurgery

## 2022-03-11 DIAGNOSIS — M5412 Radiculopathy, cervical region: Secondary | ICD-10-CM

## 2022-03-22 ENCOUNTER — Ambulatory Visit
Admission: RE | Admit: 2022-03-22 | Discharge: 2022-03-22 | Disposition: A | Discharge status: Home / Self Care | Payer: Medicaid Other | Source: Ambulatory Visit | Attending: Neurosurgery | Admitting: Neurosurgery

## 2022-03-22 DIAGNOSIS — M5412 Radiculopathy, cervical region: Secondary | ICD-10-CM

## 2022-04-14 NOTE — Therapy (Signed)
OUTPATIENT PHYSICAL THERAPY CERVICAL EVALUATION   Patient Name: Elizabeth Brennan MRN: [id number] DOB:Aug 01, 1980, 42 y.o., female Today's Date: 04/15/2022  END OF SESSION:  PT End of Session - 04/15/22 1013     Visit Number 1    Number of Visits 13    Date for PT Re-Evaluation 05/31/22    Authorization Type [employer]    PT Start Time [id number]    PT Stop Time 1055    PT Time Calculation (min) 40 min    Activity Tolerance Patient tolerated treatment well    Behavior During Therapy WFL for tasks assessed/performed             Past Medical History:  Diagnosis Date   Back pain    Medical history non-contributory    Past Surgical History:  Procedure Laterality Date   ANTERIOR CERVICAL DECOMP/DISCECTOMY FUSION N/A 11/08/2021   Procedure: ACDF - C4-C5 - C5-C6;  Surgeon: Earnie Larsson, MD;  Location: [facility];  Service: Neurosurgery;  Laterality: N/A;  3C   APPENDECTOMY     CESAREAN SECTION N/A 02/02/2018   Procedure: CESAREAN SECTION;  Surgeon: Truett Mainland, DO;  Location: [facility];  Service: Obstetrics;  Laterality: N/A;   Patient Active Problem List   Diagnosis Date Noted   Cervical spondylosis with myelopathy and radiculopathy 11/08/2021   Post-dates pregnancy 02/02/2018    PCP: none   REFERRING PROVIDER: Earnie Larsson, MD   REFERRING DIAG:   [phone] (ICD-10-CM) - Radiculopathy, cervical region    THERAPY DIAG:  Radiculopathy, cervical region  Muscle weakness (generalized)  Abnormal posture  Rationale for Evaluation and Treatment: Rehabilitation  ONSET DATE: 11/08/21  SUBJECTIVE:                                                                                                                                                                                                        In-person interpreter utilized  SUBJECTIVE STATEMENT: Patient underwent cervical fusion in October for chronic neck and LUE pain that had been ongoing for 17 years and continues to report pain  in the same area. She feels like if she relaxes her left shoulder she feels more pain and needs to keep it raised (shoulder shrug). She reports the pain is in the posterior neck and travels about the LUE all the way to fingers. She reports the numbness down the LUE was relieved after her surgery, but her pain is still the same. Patient reports that her surgeon told her the pain can continue for 6 months after surgery.   Hand dominance: Right  PERTINENT HISTORY:  ACDF C4-5, C5-6 11/08/21  PAIN:  Are you having pain? Yes: NPRS scale: 6/10 Pain location: posterior neck to LUE Pain description: dull, ache Aggravating factors: working, looking up, looking down Relieving factors: pain medication  PRECAUTIONS: None  WEIGHT BEARING RESTRICTIONS: No  FALLS:  Has patient fallen in last 6 months? No  LIVING ENVIRONMENT: Lives with: lives with their family Lives in: House/apartment Stairs: No Has following equipment at home: None  OCCUPATION: Oncologist   PLOF: Independent  PATIENT GOALS: "recover from this pain."   OBJECTIVE:   DIAGNOSTIC FINDINGS:  IMPRESSION: 1. Good appearance in the fusion segment from C4 through C6. 2. C3-4: Bulging of the disc as seen previously. Mild narrowing of the canal with AP diameter of 7.8 mm. Effacement of the subarachnoid space but no compression of the cord. No foraminal stenosis.  PATIENT SURVEYS:  NDI 21/45; 47% disability   COGNITION: Overall cognitive status: Within functional limits for tasks assessed  SENSATION: WFL  POSTURE: rounded shoulders and forward head  PALPATION: Diffuse tenderness about posterior neck, Lt upper trap    CERVICAL ROM:   Active ROM A/PROM (deg) eval  Flexion 35 pn  Extension 40 pn  Right lateral flexion 20 pn  Left lateral flexion 20  Right rotation 60 pn  Left rotation 30   (Blank rows = not tested)  UPPER EXTREMITY ROM:  Active ROM Right eval Left eval  Shoulder flexion WNL WNL   Shoulder extension    Shoulder abduction WNL 90 pn LUE  Shoulder adduction    Shoulder extension    Shoulder internal rotation    Shoulder external rotation    Elbow flexion    Elbow extension    Wrist flexion    Wrist extension    Wrist ulnar deviation    Wrist radial deviation    Wrist pronation    Wrist supination     (Blank rows = not tested)  UPPER EXTREMITY MMT:  MMT Right eval Left eval  Shoulder flexion 5 4  Shoulder extension    Shoulder abduction 5 4+  Shoulder adduction    Shoulder extension    Shoulder internal rotation    Shoulder external rotation    Middle trapezius    Lower trapezius    Elbow flexion 5 4  Elbow extension 5 4  Wrist flexion 5 4+  Wrist extension 5 4  Wrist ulnar deviation    Wrist radial deviation    Wrist pronation    Wrist supination    Grip strength 35 20   (Blank rows = not tested)  CERVICAL SPECIAL TESTS:  N/A  FUNCTIONAL TESTS:  N/A  [facility] Adult PT Treatment:                                                DATE: 04/15/22 Therapeutic Exercise: Demonstrated and issue initial HEP.   Therapeutic Activity: Education on assessment findings that will be addressed throughout duration of POC.     PATIENT EDUCATION:  Education details: see treatment  Person educated: Patient Education method: Explanation, Demonstration, Tactile cues, Verbal cues, and Handouts Education comprehension: verbalized understanding, returned demonstration, verbal cues required, tactile cues required, and needs further education  HOME EXERCISE PROGRAM: Access Code: [email or web address] URL: [email or web address] Date: 04/15/2022 Prepared by: Gwendolyn Grant  Exercises - Seated Cervical Retraction  - 1 x daily -  7 x weekly - 2 sets - 10 reps - Doorway Pec Stretch at 60 Elevation  - 1 x daily - 7 x weekly - 3 sets - 30 sec  hold - Standing Single Arm Elbow Flexion with Resistance  - 1 x daily - 7 x weekly - 2 sets - 10 reps - Standing Elbow  Extension with Self-Anchored Resistance  - 1 x daily - 7 x weekly - 2 sets - 10 reps  ASSESSMENT:  CLINICAL IMPRESSION: Patient is a 42 y.o. female who was seen today for physical therapy evaluation and treatment for s/p ACDF C4-5, C5-6 on 11/08/21 reporting no change in her neck and LUE pain since the surgery. She reports that her LUE numbness was resolved following her surgery, but her pain remains the same. Upon assessment she is noted to have limited and painful cervical AROM, LUE weakness, and postural abnormalities. She will benefit from skilled PT to address the above stated deficits in order to optimize her function and assist in overall pain reduction.   OBJECTIVE IMPAIRMENTS: decreased activity tolerance, decreased knowledge of condition, decreased ROM, decreased strength, increased fascial restrictions, impaired flexibility, impaired UE functional use, improper body mechanics, postural dysfunction, and pain.   ACTIVITY LIMITATIONS: carrying, lifting, sitting, standing, reach over head, and hygiene/grooming  PARTICIPATION LIMITATIONS: meal prep, cleaning, laundry, driving, shopping, community activity, and occupation  PERSONAL FACTORS: Age, Fitness, Profession, and Time since onset of injury/illness/exacerbation are also affecting patient's functional outcome.   REHAB POTENTIAL: Good  CLINICAL DECISION MAKING: Stable/uncomplicated  EVALUATION COMPLEXITY: Low   GOALS: Goals reviewed with patient? Yes  SHORT TERM GOALS: Target date: 05/06/2022    Patient will be independent and compliant with initial HEP.   Baseline: see above  Goal status: INITIAL  2.  Patient will demonstrate at least 45 degrees of Lt cervical rotation AROM to improve ability to complete head turns while driving/riding in the car.  Baseline: see above  Goal status: INITIAL  3.  Patient will demonstrate at least 120 degrees of Lt shoulder abduction AROM to improve ability to complete reaching activity.   Baseline: see above  Goal status: INITIAL  4.  Patient will demonstrate at least 30lbs of Lt grip strength to improve ability to carry/hold objects.  Baseline: see above  Goal status: INITIAL   LONG TERM GOALS: Target date: 05/31/2022    Patient will score </=35% disability on the NDI to signify clinically meaningful improvement in functional abilities.  Baseline: see above  Goal status: INITIAL  2.  Patient will demonstrate 5/5 Lt shoulder and elbow strength to improve ability to lift objects.  Baseline: see above  Goal status: INITIAL  3.  Patient will report pain as </= 4/10 to reduce her current functional limitations.  Baseline: see above  Goal status: INITIAL  4.  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  Baseline: see above  Goal status: INITIAL  5.  Patient will be independent with advanced home program to progress/maintain current level of function.  Baseline: initial HEP issued  Goal status: INITIAL    PLAN:  PT FREQUENCY: 1-2x/week  PT DURATION: 6 weeks  PLANNED INTERVENTIONS: Therapeutic exercises, Therapeutic activity, Neuromuscular re-education, Balance training, Patient/Family education, Self Care, Joint mobilization, Aquatic Therapy, Dry Needling, Cryotherapy, Moist heat, Taping, Manual therapy, and Re-evaluation  PLAN FOR NEXT SESSION: review and progress HEP prn; posture and LUE strengthening, cervical mobility, manual as needed.  Gwendolyn Grant, PT, DPT, ATC 04/15/22 12:01 PM

## 2022-04-15 ENCOUNTER — Ambulatory Visit: Payer: Medicaid Other | Attending: Neurosurgery

## 2022-04-15 ENCOUNTER — Other Ambulatory Visit: Payer: Self-pay

## 2022-04-15 DIAGNOSIS — M6281 Muscle weakness (generalized): Secondary | ICD-10-CM | POA: Diagnosis present

## 2022-04-15 DIAGNOSIS — M5412 Radiculopathy, cervical region: Secondary | ICD-10-CM | POA: Insufficient documentation

## 2022-04-15 DIAGNOSIS — R293 Abnormal posture: Secondary | ICD-10-CM | POA: Diagnosis present

## 2022-04-28 NOTE — Therapy (Signed)
OUTPATIENT PHYSICAL THERAPY TREATMENT NOTE   Patient Name: Elizabeth Brennan MRN: [id number] DOB:1980/06/12, 42 y.o., female Today's Date: 04/29/2022  PCP: Harlen Labs, NP REFERRING PROVIDER: Julio Sicks, MD  END OF SESSION:   PT End of Session - 04/29/22 0848     Visit Number 2    Number of Visits 13    Date for PT Re-Evaluation 05/31/22    Authorization Type [employer]    PT Start Time 0847    PT Stop Time 0928    PT Time Calculation (min) 41 min    Activity Tolerance Patient tolerated treatment well    Behavior During Therapy [facility] for tasks assessed/performed             Past Medical History:  Diagnosis Date   Back pain    Medical history non-contributory    Past Surgical History:  Procedure Laterality Date   ANTERIOR CERVICAL DECOMP/DISCECTOMY FUSION N/A 11/08/2021   Procedure: ACDF - C4-C5 - C5-C6;  Surgeon: Julio Sicks, MD;  Location: [facility];  Service: Neurosurgery;  Laterality: N/A;  3C   APPENDECTOMY     CESAREAN SECTION N/A 02/02/2018   Procedure: CESAREAN SECTION;  Surgeon: Levie Heritage, DO;  Location: [facility];  Service: Obstetrics;  Laterality: N/A;   Patient Active Problem List   Diagnosis Date Noted   Cervical spondylosis with myelopathy and radiculopathy 11/08/2021   Post-dates pregnancy 02/02/2018    REFERRING DIAG: M54.12 (ICD-10-CM) - Radiculopathy, cervical region   THERAPY DIAG:  Radiculopathy, cervical region  Muscle weakness (generalized)  Abnormal posture  Rationale for Evaluation and Treatment Rehabilitation  PERTINENT HISTORY: ACDF C4-5, C5-6 11/08/21   PRECAUTIONS: none   SUBJECTIVE:                                                                                                                                                                                     Video interpreter utilized  SUBJECTIVE STATEMENT:  "Much better. I did my exercises only 1 time."    PAIN:  Are you having pain? Yes: NPRS scale: 6/10 Pain  location: posterior neck to LUE Pain description: dull, ache Aggravating factors: working, looking up, looking down Relieving factors: pain medication   OBJECTIVE: (objective measures completed at initial evaluation unless otherwise dated)  DIAGNOSTIC FINDINGS:  IMPRESSION: 1. Good appearance in the fusion segment from C4 through C6. 2. C3-4: Bulging of the disc as seen previously. Mild narrowing of the canal with AP diameter of 7.8 mm. Effacement of the subarachnoid space but no compression of the cord. No foraminal stenosis.   PATIENT SURVEYS:  NDI 21/45; 47% disability    COGNITION: Overall cognitive status: Within  functional limits for tasks assessed   SENSATION: WFL   POSTURE: rounded shoulders and forward head   PALPATION: Diffuse tenderness about posterior neck, Lt upper trap               CERVICAL ROM:    Active ROM A/PROM (deg) eval 04/29/22  Flexion 35 pn   Extension 40 pn   Right lateral flexion 20 pn   Left lateral flexion 20   Right rotation 60 pn 60  Left rotation 30 34 pn   (Blank rows = not tested)   UPPER EXTREMITY ROM:   Active ROM Right eval Left eval  Shoulder flexion WNL WNL  Shoulder extension      Shoulder abduction WNL 90 pn LUE  Shoulder adduction      Shoulder extension      Shoulder internal rotation      Shoulder external rotation      Elbow flexion      Elbow extension      Wrist flexion      Wrist extension      Wrist ulnar deviation      Wrist radial deviation      Wrist pronation      Wrist supination       (Blank rows = not tested)   UPPER EXTREMITY MMT:   MMT Right eval Left eval  Shoulder flexion 5 4  Shoulder extension      Shoulder abduction 5 4+  Shoulder adduction      Shoulder extension      Shoulder internal rotation      Shoulder external rotation      Middle trapezius      Lower trapezius      Elbow flexion 5 4  Elbow extension 5 4  Wrist flexion 5 4+  Wrist extension 5 4  Wrist ulnar deviation       Wrist radial deviation      Wrist pronation      Wrist supination      Grip strength 35 20   (Blank rows = not tested)   CERVICAL SPECIAL TESTS:  N/A   FUNCTIONAL TESTS:  N/A [facility] Adult PT Treatment:                                                DATE: 04/29/22 Therapeutic Exercise: Supine cervical retraction 2 x 10; 5 sec hold Supine horizontal shoulder abduction yellow band 2 x 10  Bicep curl yellow band 2 x 10  Tricep extension yellow band 2 x 10  Seated cervical retraction x 10 Pec doorway stretch x 30 sec   [facility] Adult PT Treatment:                                                DATE: 04/15/22 Therapeutic Exercise: Demonstrated and issue initial HEP.    Therapeutic Activity: Education on assessment findings that will be addressed throughout duration of POC.        PATIENT EDUCATION:  Education details: HEP review Person educated: Patient Education method: Explanation, Demonstration, Actor cues, Verbal cues Education comprehension: verbalized understanding, returned demonstration, verbal cues required, tactile cues required, and needs further education   HOME EXERCISE PROGRAM: Access Code: [email or web address]  URL: [email or web address] Date: 04/15/2022 Prepared by: Letitia Libra   Exercises - Seated Cervical Retraction  - 1 x daily - 7 x weekly - 2 sets - 10 reps - Doorway Pec Stretch at 60 Elevation  - 1 x daily - 7 x weekly - 3 sets - 30 sec  hold - Standing Single Arm Elbow Flexion with Resistance  - 1 x daily - 7 x weekly - 2 sets - 10 reps - Standing Elbow Extension with Self-Anchored Resistance  - 1 x daily - 7 x weekly - 2 sets - 10 reps   ASSESSMENT:   CLINICAL IMPRESSION: Patient arrives for first treatment session reporting ongoing neck and LUE pain and has only completed her HEP once since the evaluation. Educated patient on importance of adhering to HEP to assist in pain reduction/improving function with patient verbalizing understanding.  Focused on reviewing HEP and progression of postural strengthening with fairly good tolerance. She reported pain with strengthening exercises, pointing at targeted musculature that is being strengthened, but is unable to describe the type of pain she is experiencing so it is unclear if this is pain vs. muscle activation/soreness. Consistent postural cues required in sitting to reduce upper trap engagement and maintain neutral cervical alignment. No changes made to HEP at this time. She reported an improvement in her pain at conclusion of session rated as 4/10.    OBJECTIVE IMPAIRMENTS: decreased activity tolerance, decreased knowledge of condition, decreased ROM, decreased strength, increased fascial restrictions, impaired flexibility, impaired UE functional use, improper body mechanics, postural dysfunction, and pain.    ACTIVITY LIMITATIONS: carrying, lifting, sitting, standing, reach over head, and hygiene/grooming   PARTICIPATION LIMITATIONS: meal prep, cleaning, laundry, driving, shopping, community activity, and occupation   PERSONAL FACTORS: Age, Fitness, Profession, and Time since onset of injury/illness/exacerbation are also affecting patient's functional outcome.    REHAB POTENTIAL: Good   CLINICAL DECISION MAKING: Stable/uncomplicated   EVALUATION COMPLEXITY: Low     GOALS: Goals reviewed with patient? Yes   SHORT TERM GOALS: Target date: 05/06/2022       Patient will be independent and compliant with initial HEP.    Baseline: see above  Goal status: ongoing    2.  Patient will demonstrate at least 45 degrees of Lt cervical rotation AROM to improve ability to complete head turns while driving/riding in the car.  Baseline: see above  Goal status: INITIAL   3.  Patient will demonstrate at least 120 degrees of Lt shoulder abduction AROM to improve ability to complete reaching activity.  Baseline: see above  Goal status: INITIAL   4.  Patient will demonstrate at least 30lbs  of Lt grip strength to improve ability to carry/hold objects.  Baseline: see above  Goal status: INITIAL     LONG TERM GOALS: Target date: 05/31/2022       Patient will score </=35% disability on the NDI to signify clinically meaningful improvement in functional abilities.  Baseline: see above  Goal status: INITIAL   2.  Patient will demonstrate 5/5 Lt shoulder and elbow strength to improve ability to lift objects.  Baseline: see above  Goal status: INITIAL   3.  Patient will report pain as </= 4/10 to reduce her current functional limitations.  Baseline: see above  Goal status: INITIAL   4.  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  Baseline: see above  Goal status: INITIAL   5.  Patient  will be independent with advanced home program to progress/maintain current level of function.  Baseline: initial HEP issued  Goal status: INITIAL       PLAN:   PT FREQUENCY: 1-2x/week   PT DURATION: 6 weeks   PLANNED INTERVENTIONS: Therapeutic exercises, Therapeutic activity, Neuromuscular re-education, Balance training, Patient/Family education, Self Care, Joint mobilization, Aquatic Therapy, Dry Needling, Cryotherapy, Moist heat, Taping, Manual therapy, and Re-evaluation   PLAN FOR NEXT SESSION: review and progress HEP prn; posture and LUE strengthening, cervical mobility, manual as needed.     Letitia Libra, PT, DPT, ATC 04/29/22 9:29 AM

## 2022-04-29 ENCOUNTER — Ambulatory Visit: Payer: Medicaid Other

## 2022-04-29 DIAGNOSIS — M6281 Muscle weakness (generalized): Secondary | ICD-10-CM

## 2022-04-29 DIAGNOSIS — M5412 Radiculopathy, cervical region: Secondary | ICD-10-CM | POA: Diagnosis not present

## 2022-04-29 DIAGNOSIS — R293 Abnormal posture: Secondary | ICD-10-CM

## 2022-05-01 ENCOUNTER — Encounter: Payer: Medicaid Other | Admitting: Physical Therapy

## 2022-05-06 ENCOUNTER — Encounter: Payer: Self-pay | Admitting: Physical Therapy

## 2022-05-06 ENCOUNTER — Ambulatory Visit: Payer: Medicaid Other | Admitting: Physical Therapy

## 2022-05-06 DIAGNOSIS — M6281 Muscle weakness (generalized): Secondary | ICD-10-CM

## 2022-05-06 DIAGNOSIS — M5412 Radiculopathy, cervical region: Secondary | ICD-10-CM

## 2022-05-06 DIAGNOSIS — R293 Abnormal posture: Secondary | ICD-10-CM

## 2022-05-06 NOTE — Therapy (Addendum)
OUTPATIENT PHYSICAL THERAPY TREATMENT NOTE  PHYSICAL THERAPY DISCHARGE SUMMARY  Visits from Start of Care: 3  Current functional level related to goals / functional outcomes: No re-assessment of goals   Remaining deficits: Status unknown    Education / Equipment: N/A    Patient agrees to discharge. Patient goals were not met. Patient is being discharged due to attendance.   Patient Name: Elizabeth Brennan MRN: [id number] DOB:07/23/1980, 42 y.o., female Today's Date: 05/06/2022  PCP: Harlen Labs, NP REFERRING PROVIDER: Julio Sicks, MD  END OF SESSION:   PT End of Session - 05/06/22 0812     Visit Number 3    Number of Visits 13    Date for PT Re-Evaluation 05/31/22    Authorization Type [employer]    PT Start Time 0809    PT Stop Time 0850    PT Time Calculation (min) 41 min    Activity Tolerance Patient tolerated treatment well    Behavior During Therapy [facility] for tasks assessed/performed              Past Medical History:  Diagnosis Date   Back pain    Medical history non-contributory    Past Surgical History:  Procedure Laterality Date   ANTERIOR CERVICAL DECOMP/DISCECTOMY FUSION N/A 11/08/2021   Procedure: ACDF - C4-C5 - C5-C6;  Surgeon: Julio Sicks, MD;  Location: [facility] OR;  Service: Neurosurgery;  Laterality: N/A;  3C   APPENDECTOMY     CESAREAN SECTION N/A 02/02/2018   Procedure: CESAREAN SECTION;  Surgeon: Levie Heritage, DO;  Location: [facility] BIRTHING SUITES;  Service: Obstetrics;  Laterality: N/A;   Patient Active Problem List   Diagnosis Date Noted   Cervical spondylosis with myelopathy and radiculopathy 11/08/2021   Post-dates pregnancy 02/02/2018    REFERRING DIAG: M54.12 (ICD-10-CM) - Radiculopathy, cervical region   THERAPY DIAG:  Radiculopathy, cervical region  Muscle weakness (generalized)  Abnormal posture  Rationale for Evaluation and Treatment Rehabilitation  PERTINENT HISTORY: ACDF C4-5, C5-6 11/08/21   PRECAUTIONS: none    SUBJECTIVE:                                                                                                                                                                                     Video interpreter utilized    SUBJECTIVE STATEMENT:   The exercises are helping.  She still has a little pain in her L arm.  Fingers painful at times.    PAIN:  Are you having pain? Yes: NPRS scale: 6-7/10 Pain location: posterior neck to LUE Pain description: dull, ache Aggravating factors: working, looking up, looking down Relieving factors: pain medication   OBJECTIVE: (objective measures completed  at initial evaluation unless otherwise dated)  DIAGNOSTIC FINDINGS:  IMPRESSION: 1. Good appearance in the fusion segment from C4 through C6. 2. C3-4: Bulging of the disc as seen previously. Mild narrowing of the canal with AP diameter of 7.8 mm. Effacement of the subarachnoid space but no compression of the cord. No foraminal stenosis.   PATIENT SURVEYS:  NDI 21/45; 47% disability    COGNITION: Overall cognitive status: Within functional limits for tasks assessed   SENSATION: WFL   POSTURE: rounded shoulders and forward head   PALPATION: Diffuse tenderness about posterior neck, Lt upper trap               CERVICAL ROM:    Active ROM A/PROM (deg) eval 04/29/22  Flexion 35 pn   Extension 40 pn   Right lateral flexion 20 pn   Left lateral flexion 20   Right rotation 60 pn 60  Left rotation 30 34 pn   (Blank rows = not tested)   UPPER EXTREMITY ROM:   Active ROM Right eval Left eval  Shoulder flexion WNL WNL  Shoulder extension      Shoulder abduction WNL 90 pn LUE  Shoulder adduction      Shoulder extension      Shoulder internal rotation      Shoulder external rotation      Elbow flexion      Elbow extension      Wrist flexion      Wrist extension      Wrist ulnar deviation      Wrist radial deviation      Wrist pronation      Wrist supination       (Blank  rows = not tested)   UPPER EXTREMITY MMT:   MMT Right eval Left eval  Shoulder flexion 5 4  Shoulder extension      Shoulder abduction 5 4+  Shoulder adduction      Shoulder extension      Shoulder internal rotation      Shoulder external rotation      Middle trapezius      Lower trapezius      Elbow flexion 5 4  Elbow extension 5 4  Wrist flexion 5 4+  Wrist extension 5 4  Wrist ulnar deviation      Wrist radial deviation      Wrist pronation      Wrist supination      Grip strength 35 20   (Blank rows = not tested)   CERVICAL SPECIAL TESTS:  N/A   FUNCTIONAL TESTS:  N/A  [facility] Adult PT Treatment:                                                DATE: 05/06/22 Therapeutic Exercise: UBE level 1, 5 min  Supine cervical retraction 2 x 10; 5 sec hold towel behind neck Supine horizontal shoulder abduction red band 2 x 10  Narrow looped band overhead lift  x 10  Cervical rotation between sets  Tricep extension red band x 15  Biceps red band x 15 , cues needed for set up  Corner stretch 3 x 20 sec  Seated cervical rotation and sidebending stretching   [facility] Adult PT Treatment:  DATE: 04/29/22 Therapeutic Exercise: Supine cervical retraction 2 x 10; 5 sec hold Supine horizontal shoulder abduction yellow band 2 x 10  Bicep curl yellow band 2 x 10  Tricep extension yellow band 2 x 10  Seated cervical retraction x 10 Pec doorway stretch x 30 sec   [facility] Adult PT Treatment:                                                DATE: 04/15/22 Therapeutic Exercise: Demonstrated and issue initial HEP.    Therapeutic Activity: Education on assessment findings that will be addressed throughout duration of POC.        PATIENT EDUCATION:  Education details: HEP review Person educated: Patient Education method: Explanation, Demonstration, Actor cues, Verbal cues Education comprehension: verbalized understanding, returned demonstration,  verbal cues required, tactile cues required, and needs further education   HOME EXERCISE PROGRAM: Access Code: [email or web address] URL: [email or web address] Date: 04/15/2022 Prepared by: Letitia Libra   Exercises - Seated Cervical Retraction  - 1 x daily - 7 x weekly - 2 sets - 10 reps - Doorway Pec Stretch at 60 Elevation  - 1 x daily - 7 x weekly - 3 sets - 30 sec  hold - Standing Single Arm Elbow Flexion with Resistance  - 1 x daily - 7 x weekly - 2 sets - 10 reps - Standing Elbow Extension with Self-Anchored Resistance  - 1 x daily - 7 x weekly - 2 sets - 10 reps - horizon abduction red band supine 1 x day 2 x 10    ASSESSMENT:   CLINICAL IMPRESSION: Patient reportedly feeling improvement in pain with doing her exercises at home.  She had mild difficulty with gripping band on L UE.  She was given red band for HEP. She was offered MHP to reduce muscle tension in L upper trap, levator.    OBJECTIVE IMPAIRMENTS: decreased activity tolerance, decreased knowledge of condition, decreased ROM, decreased strength, increased fascial restrictions, impaired flexibility, impaired UE functional use, improper body mechanics, postural dysfunction, and pain.    ACTIVITY LIMITATIONS: carrying, lifting, sitting, standing, reach over head, and hygiene/grooming   PARTICIPATION LIMITATIONS: meal prep, cleaning, laundry, driving, shopping, community activity, and occupation   PERSONAL FACTORS: Age, Fitness, Profession, and Time since onset of injury/illness/exacerbation are also affecting patient's functional outcome.    REHAB POTENTIAL: Good   CLINICAL DECISION MAKING: Stable/uncomplicated   EVALUATION COMPLEXITY: Low     GOALS: Goals reviewed with patient? Yes   SHORT TERM GOALS: Target date: 05/06/2022       Patient will be independent and compliant with initial HEP.    Baseline: see above  Goal status: ongoing    2.  Patient will demonstrate at least 45 degrees of Lt cervical  rotation AROM to improve ability to complete head turns while driving/riding in the car.  Baseline: see above  Goal status: INITIAL   3.  Patient will demonstrate at least 120 degrees of Lt shoulder abduction AROM to improve ability to complete reaching activity.  Baseline: see above  Goal status: INITIAL   4.  Patient will demonstrate at least 30lbs of Lt grip strength to improve ability to carry/hold objects.  Baseline: see above  Goal status: INITIAL     LONG TERM GOALS: Target date: 05/31/2022       Patient will score </=35% disability on  the NDI to signify clinically meaningful improvement in functional abilities.  Baseline: see above  Goal status: INITIAL   2.  Patient will demonstrate 5/5 Lt shoulder and elbow strength to improve ability to lift objects.  Baseline: see above  Goal status: INITIAL   3.  Patient will report pain as </= 4/10 to reduce her current functional limitations.  Baseline: see above  Goal status: INITIAL   4.  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  Baseline: see above  Goal status: INITIAL   5.  Patient will be independent with advanced home program to progress/maintain current level of function.  Baseline: initial HEP issued  Goal status: INITIAL       PLAN:   PT FREQUENCY: n/a   PT DURATION: n/a   PLANNED INTERVENTIONS: Therapeutic exercises, Therapeutic activity, Neuromuscular re-education, Balance training, Patient/Family education, Self Care, Joint mobilization, Aquatic Therapy, Dry Needling, Cryotherapy, Moist heat, Taping, Manual therapy, and Re-evaluation   PLAN FOR NEXT SESSION: Silvio Pate, PT 05/06/22 9:02 AM Phone: [phone] Fax: [phone]   Letitia Libra, PT, DPT, ATC 06/13/22 10:12 AM

## 2022-05-07 ENCOUNTER — Ambulatory Visit: Payer: Medicaid Other | Admitting: Orthopedic Surgery

## 2022-05-12 NOTE — Therapy (Deleted)
OUTPATIENT PHYSICAL THERAPY TREATMENT NOTE   Patient Name: Elizabeth Brennan MRN: [id number] DOB:1980/07/30, 42 y.o., female Today's Date: 05/12/2022  PCP: Harlen Labs, NP REFERRING PROVIDER: Julio Sicks, MD  END OF SESSION:      Past Medical History:  Diagnosis Date   Back pain    Medical history non-contributory    Past Surgical History:  Procedure Laterality Date   ANTERIOR CERVICAL DECOMP/DISCECTOMY FUSION N/A 11/08/2021   Procedure: ACDF - C4-C5 - C5-C6;  Surgeon: Julio Sicks, MD;  Location: [facility] OR;  Service: Neurosurgery;  Laterality: N/A;  3C   APPENDECTOMY     CESAREAN SECTION N/A 02/02/2018   Procedure: CESAREAN SECTION;  Surgeon: Levie Heritage, DO;  Location: [facility] BIRTHING SUIT[facility];  Service: Obstetrics;  Laterality: N/A;   Patient Active Problem List   Diagnosis Date Noted   Cervical spondylosis with myelopathy and radiculopathy 11/08/2021   Post-dates pregnancy 02/02/2018    REFERRING DIAG: M54.12 (ICD-10-CM) - Radiculopathy, cervical region   THERAPY DIAG:  No diagnosis found.  Rationale for Evaluation and Treatment Rehabilitation  PERTINENT HISTORY: ACDF C4-5, C5-6 11/08/21   PRECAUTIONS: none   SUBJECTIVE:                                                                                                                                                                                     Video interpreter utilized    SUBJECTIVE STATEMENT:   The exercises are helping.  She still has a little pain in her L arm.  Fingers painful at times.    PAIN:  Are you having pain? Yes: NPRS scale: 6-7/10 Pain location: posterior neck to LUE Pain description: dull, ache Aggravating factors: working, looking up, looking down Relieving factors: pain medication   OBJECTIVE: (objective measures completed at initial evaluation unless otherwise dated)  DIAGNOSTIC FINDINGS:  IMPRESSION: 1. Good appearance in the fusion segment from C4 through C6. 2. C3-4: Bulging of  the disc as seen previously. Mild narrowing of the canal with AP diameter of 7.8 mm. Effacement of the subarachnoid space but no compression of the cord. No foraminal stenosis.   PATIENT SURVEYS:  NDI 21/45; 47% disability    COGNITION: Overall cognitive status: Within functional limits for tasks assessed   SENSATION: WFL   POSTURE: rounded shoulders and forward head   PALPATION: Diffuse tenderness about posterior neck, Lt upper trap               CERVICAL ROM:    Active ROM A/PROM (deg) eval 04/29/22  Flexion 35 pn   Extension 40 pn   Right lateral flexion 20 pn   Left lateral flexion 20   Right rotation 60  pn 60  Left rotation 30 34 pn   (Blank rows = not tested)   UPPER EXTREMITY ROM:   Active ROM Right eval Left eval  Shoulder flexion WNL WNL  Shoulder extension      Shoulder abduction WNL 90 pn LUE  Shoulder adduction      Shoulder extension      Shoulder internal rotation      Shoulder external rotation      Elbow flexion      Elbow extension      Wrist flexion      Wrist extension      Wrist ulnar deviation      Wrist radial deviation      Wrist pronation      Wrist supination       (Blank rows = not tested)   UPPER EXTREMITY MMT:   MMT Right eval Left eval  Shoulder flexion 5 4  Shoulder extension      Shoulder abduction 5 4+  Shoulder adduction      Shoulder extension      Shoulder internal rotation      Shoulder external rotation      Middle trapezius      Lower trapezius      Elbow flexion 5 4  Elbow extension 5 4  Wrist flexion 5 4+  Wrist extension 5 4  Wrist ulnar deviation      Wrist radial deviation      Wrist pronation      Wrist supination      Grip strength 35 20   (Blank rows = not tested)   CERVICAL SPECIAL TESTS:  N/A   FUNCTIONAL TESTS:  N/A  [facility] Adult PT Treatment:                                                DATE: 05/13/22 Therapeutic Exercise: *** Manual Therapy: *** Neuromuscular  re-ed: *** Therapeutic Activity: *** Modalities: *** Self Care: ***  [facility] Adult PT Treatment:                                                DATE: 05/06/22 Therapeutic Exercise: UBE level 1, 5 min  Supine cervical retraction 2 x 10; 5 sec hold towel behind neck Supine horizontal shoulder abduction red band 2 x 10  Narrow looped band overhead lift  x 10  Cervical rotation between sets  Tricep extension red band x 15  Biceps red band x 15 , cues needed for set up  Corner stretch 3 x 20 sec  Seated cervical rotation and sidebending stretching   [facility] Adult PT Treatment:                                                DATE: 04/29/22 Therapeutic Exercise: Supine cervical retraction 2 x 10; 5 sec hold Supine horizontal shoulder abduction yellow band 2 x 10  Bicep curl yellow band 2 x 10  Tricep extension yellow band 2 x 10  Seated cervical retraction x 10 Pec doorway stretch x 30 sec   [facility] Adult PT  Treatment:                                                DATE: 04/15/22 Therapeutic Exercise: Demonstrated and issue initial HEP.    Therapeutic Activity: Education on assessment findings that will be addressed throughout duration of POC.        PATIENT EDUCATION:  Education details: HEP review Person educated: Patient Education method: Explanation, Demonstration, Actor cues, Verbal cues Education comprehension: verbalized understanding, returned demonstration, verbal cues required, tactile cues required, and needs further education   HOME EXERCISE PROGRAM: Access Code: [email or web address] URL: [email or web address] Date: 04/15/2022 Prepared by: Letitia Libra   Exercises - Seated Cervical Retraction  - 1 x daily - 7 x weekly - 2 sets - 10 reps - Doorway Pec Stretch at 60 Elevation  - 1 x daily - 7 x weekly - 3 sets - 30 sec  hold - Standing Single Arm Elbow Flexion with Resistance  - 1 x daily - 7 x weekly - 2 sets - 10 reps - Standing Elbow Extension with Self-Anchored  Resistance  - 1 x daily - 7 x weekly - 2 sets - 10 reps - horizon abduction red band supine 1 x day 2 x 10    ASSESSMENT:   CLINICAL IMPRESSION: Patient reportedly feeling improvement in pain with doing her exercises at home.  She had mild difficulty with gripping band on L UE.  She was given red band for HEP. She was offered MHP to reduce muscle tension in L upper trap, levator.    OBJECTIVE IMPAIRMENTS: decreased activity tolerance, decreased knowledge of condition, decreased ROM, decreased strength, increased fascial restrictions, impaired flexibility, impaired UE functional use, improper body mechanics, postural dysfunction, and pain.    ACTIVITY LIMITATIONS: carrying, lifting, sitting, standing, reach over head, and hygiene/grooming   PARTICIPATION LIMITATIONS: meal prep, cleaning, laundry, driving, shopping, community activity, and occupation   PERSONAL FACTORS: Age, Fitness, Profession, and Time since onset of injury/illness/exacerbation are also affecting patient's functional outcome.    REHAB POTENTIAL: Good   CLINICAL DECISION MAKING: Stable/uncomplicated   EVALUATION COMPLEXITY: Low     GOALS: Goals reviewed with patient? Yes   SHORT TERM GOALS: Target date: 05/06/2022       Patient will be independent and compliant with initial HEP.    Baseline: see above  Goal status: ongoing    2.  Patient will demonstrate at least 45 degrees of Lt cervical rotation AROM to improve ability to complete head turns while driving/riding in the car.  Baseline: see above  Goal status: INITIAL   3.  Patient will demonstrate at least 120 degrees of Lt shoulder abduction AROM to improve ability to complete reaching activity.  Baseline: see above  Goal status: INITIAL   4.  Patient will demonstrate at least 30lbs of Lt grip strength to improve ability to carry/hold objects.  Baseline: see above  Goal status: INITIAL     LONG TERM GOALS: Target date: 05/31/2022       Patient will  score </=35% disability on the NDI to signify clinically meaningful improvement in functional abilities.  Baseline: see above  Goal status: INITIAL   2.  Patient will demonstrate 5/5 Lt shoulder and elbow strength to improve ability to lift objects.  Baseline: see above  Goal status: INITIAL   3.  Patient will report pain as </=  4/10 to reduce her current functional limitations.  Baseline: see above  Goal status: INITIAL   4.  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  Baseline: see above  Goal status: INITIAL   5.  Patient will be independent with advanced home program to progress/maintain current level of function.  Baseline: initial HEP issued  Goal status: INITIAL       PLAN:   PT FREQUENCY: 1-2x/week   PT DURATION: 6 weeks   PLANNED INTERVENTIONS: Therapeutic exercises, Therapeutic activity, Neuromuscular re-education, Balance training, Patient/Family education, Self Care, Joint mobilization, Aquatic Therapy, Dry Needling, Cryotherapy, Moist heat, Taping, Manual therapy, and Re-evaluation   PLAN FOR NEXT SESSION: review and progress HEP prn; posture and LUE strengthening, cervical mobility, manual as needed.    Karie Mainland, PT 05/12/22 11:59 AM Phone: [phone] Fax: [phone]

## 2022-05-13 ENCOUNTER — Ambulatory Visit: Payer: Medicaid Other | Admitting: Physical Therapy

## 2022-05-15 ENCOUNTER — Ambulatory Visit: Payer: Medicaid Other | Admitting: Orthopedic Surgery

## 2022-05-20 ENCOUNTER — Telehealth: Payer: Self-pay

## 2022-05-20 ENCOUNTER — Ambulatory Visit: Payer: Medicaid Other | Attending: Neurosurgery

## 2022-05-20 NOTE — Telephone Encounter (Signed)
Utilized video interpreter to leave voicemail regarding missed PT appointment. Reviewed attendance policy and ask that she call back if she would like to reschedule.   Letitia Libra, PT, DPT, ATC 05/20/22 9:09 AM

## 2022-06-12 ENCOUNTER — Ambulatory Visit: Payer: Medicaid Other

## 2022-06-18 ENCOUNTER — Other Ambulatory Visit (INDEPENDENT_AMBULATORY_CARE_PROVIDER_SITE_OTHER): Payer: Medicaid Other

## 2022-06-18 ENCOUNTER — Ambulatory Visit (INDEPENDENT_AMBULATORY_CARE_PROVIDER_SITE_OTHER): Payer: Medicaid Other | Admitting: Orthopedic Surgery

## 2022-06-18 ENCOUNTER — Encounter: Payer: Self-pay | Admitting: Orthopedic Surgery

## 2022-06-18 VITALS — BP 103/76 | HR 80 | Ht 68.0 in | Wt 212.0 lb

## 2022-06-18 DIAGNOSIS — M545 Low back pain, unspecified: Secondary | ICD-10-CM

## 2022-06-18 NOTE — Progress Notes (Signed)
Orthopedic Spine Surgery Office Note  Assessment: Patient is a 42 y.o. female with chronic low back pain that has gotten progressively worse.  Has pain into bilateral lower extremities in multiple distributions.  MRI does not show any significant stenosis   Plan: -Patient has tried Tylenol, ibuprofen, lumbar steroid injection -Recommended PT and pain management.  Referrals provided for both -I went back to the patient's 2020 MRI and did not see any significant stenosis which was the start of her leg symptoms.  She also had a lumbar steroid injection that gave her no relief (even temporary), so I do not think surgical intervention will provide her with any significant benefit -Patient should return to office on an as-needed basis   Patient expressed understanding of the plan and all questions were answered to the patient's satisfaction.   ___________________________________________________________________________   History:  Patient is a 42 y.o. female who presents today for lumbar spine.  Patient has had 17 years of low back pain.  There is no trauma or injury that preceded the onset of pain.  Pain is gotten progressively worse with time.  In 2020, after childbirth, she noted onset of bilateral lower extremity pain as well.  She feels the pain in multiple distributions.  She says she feels it on both the anterior and posterior aspect of the leg and the thighs and the legs.  She has been previously seen at [facility] who did not recommend any surgical intervention.  She got a lumbar steroid injection that gave her no relief whatsoever.  Her pain bothers her on a daily basis.  She feels the pain with activity and at rest.    Weakness: Yes, lower back feels weak and her left hand.  No other weakness noted Symptoms of imbalance: Denies Paresthesias and numbness: Yes, gets numbness in multiple distributions in her lower extremities.  No other numbness or  paresthesias Bowel or bladder incontinence: Denies Saddle anesthesia: Denies  Treatments tried: Tylenol, ibuprofen, lumbar steroid injection  Review of systems: Denies fevers and chills, night sweats, unexplained weight loss, history of cancer.  Has had pain that wakes her at night  Past medical history: Chronic back pain  Allergies: NKDA  Past surgical history:  C4-6 ACDF with Dr. Jordan Likes at [facility] (2023) Appendectomy  Social history: Denies use of nicotine product (smoking, vaping, patches, smokeless) Alcohol use: Denies Denies recreational drug use   Physical Exam:  General: no acute distress, appears stated age Neurologic: alert, answering questions appropriately, following commands Respiratory: unlabored breathing on room air, symmetric chest rise Psychiatric: appropriate affect, normal cadence to speech   MSK (spine):  -Strength exam      Left  Right EHL    5/5  5/5 TA    5/5  5/5 GSC    5/5  5/5 Knee extension  5/5  5/5 Hip flexion   5/5  5/5  -Sensory exam    Sensation intact to light touch in L3-S1 nerve distributions of bilateral lower extremities  -Achilles DTR: 1/4 on the left, 1/4 on the right -Patellar tendon DTR: 1/4 on the left, 1/4 on the right  -Straight leg raise: Negative bilaterally -Femoral nerve stretch test: Negative bilaterally -Clonus: no beats bilaterally  -Left hip exam: No pain through range of motion -Right hip exam: No pain through range of motion  Imaging: XR of the lumbar spine from 06/18/2022 was independently reviewed and interpreted, showing no significant degenerative changes.  No fracture or dislocation seen.  No evidence  of instability on flexion/extension views.  MRI of the lumbar spine from 08/09/2021 was independently reviewed and interpreted, showing disc desiccation at L4/5.  Left-sided small foraminal disc herniation at L3/4.  Mild foraminal stenosis bilaterally at L4/5.  No other significant stenosis seen.   Patient  name: Elizabeth Brennan Patient MRN: [id number] Date of visit: 06/18/22

## 2022-07-14 NOTE — Therapy (Signed)
OUTPATIENT PHYSICAL THERAPY THORACOLUMBAR EVALUATION   Patient Name: Elizabeth Brennan MRN: [id number] DOB:March 31, 1980, 42 y.o., female Today's Date: 07/15/2022  END OF SESSION:  PT End of Session - 07/15/22 1625     Visit Number 1    PT Start Time 1620    PT Stop Time 1655    PT Time Calculation (min) 35 min    Activity Tolerance Patient tolerated treatment well             Past Medical History:  Diagnosis Date   Back pain    Medical history non-contributory    Past Surgical History:  Procedure Laterality Date   ANTERIOR CERVICAL DECOMP/DISCECTOMY FUSION N/A 11/08/2021   Procedure: ACDF - C4-C5 - C5-C6;  Surgeon: Julio Sicks, MD;  Location: [facility] OR;  Service: Neurosurgery;  Laterality: N/A;  3C   APPENDECTOMY     CESAREAN SECTION N/A 02/02/2018   Procedure: CESAREAN SECTION;  Surgeon: Levie Heritage, DO;  Location: [facility]TH[facility];  Service: Obstetrics;  Laterality: N/A;   Patient Active Problem List   Diagnosis Date Noted   Cervical spondylosis with myelopathy and radiculopathy 11/08/2021   Post-dates pregnancy 02/02/2018    PCP: Harlen Labs, NP  REFERRING PROVIDER: London Sheer, MD  REFERRING DIAG: Low back pain, unspecified back pain laterality, unspecified chronicity, unspecified whether sciatica present [M54.50]   Rationale for Evaluation and Treatment: Rehabilitation  THERAPY DIAG:  Radiculopathy, lumbar region  ONSET DATE: 15+ years   SUBJECTIVE:                                                                                                                                                                                           SUBJECTIVE STATEMENT: Patient reports to PT with chronic lower back pain that has been getting worse. She also has radiating paresthesia in B LE. She states that after prolonged standing, it takes about 5 minutes of sitting for the pain to subside. When the pain is really bad, she just wants to lay day and not doing  anything.   She was unable to continue previous therapy d/t loss of a loved one.   Physician referral states "MRI does not show any significant stenosis. She says she feels it on both the anterior and posterior aspect of the leg and the thighs and the legs. She has been previously seen at [facility] who did not recommend any surgical intervention. She got a lumbar steroid injection that gave her no relief whatsoever."  PERTINENT HISTORY:  Relevant PMHx including Cesarean section 4 years ago, appendectomy.  PAIN:  Are you having pain? Yes: NPRS scale: ***/  10 Pain location: lower back, R>L pain  Pain description: aching pain, occasional sharp/stabbing Aggravating factors: prolonged sitting, prolonged standing Relieving factors: ***  PRECAUTIONS: None  WEIGHT BEARING RESTRICTIONS: No  FALLS:  Has patient fallen in last 6 months? No  LIVING ENVIRONMENT: Lives with: lives with their family Lives in: House/apartment Stairs: No Has following equipment at home: None  OCCUPATION: warehouse work  PLOF: Independent  PATIENT GOALS: Patient reports that she is ready to get rid of the pain.   NEXT MD VISIT: not scheduled at time of eval   OBJECTIVE:   DIAGNOSTIC FINDINGS:  06/18/22 XR Lumbar Spine Complete   Result narrative:  XR of the lumbar spine from 06/18/2022 was independently reviewed and interpreted, showing no significant degenerative changes.  No fracture or dislocation seen.  No evidence of instability on flexion/extension views.   PATIENT SURVEYS:  FOTO ***   COGNITION: Overall cognitive status: Within functional limits for tasks assessed     SENSATION: WFL   POSTURE: {posture:25561}  PALPATION: ***  LUMBAR ROM:   AROM eval  Flexion WNL, walking up with hands, going down hurts worse   Extension 20%  Right lateral flexion 70%, min p!  Left lateral flexion 70%, min p!  Right rotation   Left rotation    (Blank rows = not  tested)  LOWER EXTREMITY ROM:     Active  Right eval Left eval  Hip flexion    Hip extension    Hip abduction    Hip adduction    Hip internal rotation    Hip external rotation    Knee flexion    Knee extension    Ankle dorsiflexion    Ankle plantarflexion    Ankle inversion    Ankle eversion     (Blank rows = not tested)  LOWER EXTREMITY MMT:    MMT Right eval Left eval  Hip flexion 4+ 4+  Hip extension    Hip abduction 5 5  Hip adduction    Hip internal rotation    Hip external rotation    Knee flexion 4+ 4+  Knee extension 4+ 4+  Ankle dorsiflexion 5 5  Ankle plantarflexion    Ankle inversion    Ankle eversion     (Blank rows = not tested)  LUMBAR SPECIAL TESTS:  Straight leg raise test: {pos/neg:25243} and Thomas test: {pos/neg:25243}  FUNCTIONAL TESTS:  {Functional tests:24029}  GAIT: Distance walked: *** Assistive device utilized: {Assistive devices:23999} Level of assistance: {Levels of assistance:24026} Comments: ***  TODAY'S TREATMENT:                                                                                                                               [facility] Adult PT Treatment:  DATE: 07/15/2022  Therapeutic Exercise: *** Manual Therapy: *** Neuromuscular re-ed: *** Therapeutic Activity: *** Modalities: *** Self Care: ***    PATIENT EDUCATION:  Education details: *** Person educated: Patient via interpreter Education method: Explanation, Demonstration, and Handouts Education comprehension: verbalized understanding, returned demonstration, and needs further education  HOME EXERCISE PROGRAM: ***  ASSESSMENT:  CLINICAL IMPRESSION: Patient is a 42 y.o. female who was seen today for physical therapy evaluation and treatment for Low Back Pain with radiating symptoms to B LE. She has decreased ***. She has related pain and difficulty with ***. Patient requires skilled PT services at  this time to address relevant deficits and improve overall QOL.    OBJECTIVE IMPAIRMENTS: {opptimpairments:25111}.   ACTIVITY LIMITATIONS: {activitylimitations:27494}  PARTICIPATION LIMITATIONS: {participationrestrictions:25113}  PERSONAL FACTORS: {Personal factors:25162} are also affecting patient's functional outcome.   REHAB POTENTIAL: {rehabpotential:25112}  CLINICAL DECISION MAKING: {clinical decision making:25114}  EVALUATION COMPLEXITY: {Evaluation complexity:25115}   GOALS: Goals reviewed with patient? Yes  SHORT TERM GOALS: Target date: ***  *** Baseline: Goal status: INITIAL  2.  *** Baseline:  Goal status: INITIAL  3.  *** Baseline:  Goal status: INITIAL  4.  *** Baseline:  Goal status: INITIAL  5.  *** Baseline:  Goal status: INITIAL  6.  *** Baseline:  Goal status: INITIAL  LONG TERM GOALS: Target date: ***  *** Baseline:  Goal status: INITIAL  2.  *** Baseline:  Goal status: INITIAL  3.  *** Baseline:  Goal status: INITIAL  4.  *** Baseline:  Goal status: INITIAL  5.  *** Baseline:  Goal status: INITIAL  6.  *** Baseline:  Goal status: INITIAL  PLAN:  PT FREQUENCY: {rehab frequency:25116}  PT DURATION: {rehab duration:25117}  PLANNED INTERVENTIONS: {rehab planned interventions:25118::"Therapeutic exercises","Therapeutic activity","Neuromuscular re-education","Balance training","Gait training","Patient/Family education","Self Care","Joint mobilization"}.  PLAN FOR NEXT SESSION: Mauri Reading, PT, DPT 07/15/2022, 4:37 PM

## 2022-07-15 ENCOUNTER — Ambulatory Visit: Payer: Medicaid Other | Attending: Neurosurgery

## 2022-07-15 DIAGNOSIS — M5416 Radiculopathy, lumbar region: Secondary | ICD-10-CM

## 2022-07-15 DIAGNOSIS — M545 Low back pain, unspecified: Secondary | ICD-10-CM | POA: Diagnosis not present

## 2022-07-29 ENCOUNTER — Ambulatory Visit: Payer: Medicaid Other

## 2022-07-29 DIAGNOSIS — M5416 Radiculopathy, lumbar region: Secondary | ICD-10-CM

## 2022-07-29 NOTE — Therapy (Signed)
OUTPATIENT PHYSICAL THERAPY THORACOLUMBAR EVALUATION   Patient Name: Elizabeth Brennan MRN: [id number] DOB:1980/08/04, 42 y.o., female Today's Date: 07/15/2022  END OF SESSION:  PT End of Session - 07/29/22 1752     Visit Number 2    Number of Visits 13    Date for PT Re-Evaluation 08/27/22    Authorization Type [facility] [employer]    PT Start Time 1752    PT Stop Time 1822    PT Time Calculation (min) 30 min    Activity Tolerance Patient tolerated treatment well    Behavior During Therapy WFL for tasks assessed/performed              Past Medical History:  Diagnosis Date   Back pain    Medical history non-contributory    Past Surgical History:  Procedure Laterality Date   ANTERIOR CERVICAL DECOMP/DISCECTOMY FUSION N/A 11/08/2021   Procedure: ACDF - C4-C5 - C5-C6;  Surgeon: Julio Sicks, MD;  Location: [facility] OR;  Service: Neurosurgery;  Laterality: N/A;  3C   APPENDECTOMY     CESAREAN SECTION N/A 02/02/2018   Procedure: CESAREAN SECTION;  Surgeon: Levie Heritage, DO;  Location: [facility];  Service: Obstetrics;  Laterality: N/A;   Patient Active Problem List   Diagnosis Date Noted   Cervical spondylosis with myelopathy and radiculopathy 11/08/2021   Post-dates pregnancy 02/02/2018    PCP: Harlen Labs, NP  REFERRING PROVIDER: London Sheer, MD  REFERRING DIAG: Low back pain, unspecified back pain laterality, unspecified chronicity, unspecified whether sciatica present [M54.50]   Rationale for Evaluation and Treatment: Rehabilitation  THERAPY DIAG:  Radiculopathy, lumbar region  ONSET DATE: 15+ years   SUBJECTIVE:                                                                                                                                                                                           SUBJECTIVE STATEMENT: Patient reports that her pain is getting worse. She has tried 1 exercise since last visit.   PERTINENT HISTORY:  Relevant PMHx including  Cesarean section 4 years ago, appendectomy.  PAIN:  Are you having pain? Yes: NPRS scale: 8/10 Pain location: lower back, R>L pain, groin, anterior thigh, posterior lower leg  Pain description: aching pain, occasional sharp/stabbing Aggravating factors: prolonged sitting, prolonged standing Relieving factors: none identified at time of eval   PRECAUTIONS: None  WEIGHT BEARING RESTRICTIONS: No  FALLS:  Has patient fallen in last 6 months? No  LIVING ENVIRONMENT: Lives with: lives with their family Lives in: House/apartment Stairs: No Has following equipment at home: None  OCCUPATION: warehouse work  PLOF: Independent  PATIENT GOALS:  Patient reports that she is ready to get rid of the pain.   NEXT MD VISIT: not scheduled at time of eval   OBJECTIVE:   DIAGNOSTIC FINDINGS:  06/18/22 XR Lumbar Spine Complete   Result narrative:  XR of the lumbar spine from 06/18/2022 was independently reviewed and interpreted, showing no significant degenerative changes.  No fracture or dislocation seen.  No evidence of instability on flexion/extension views.   PATIENT SURVEYS:  FOTO 39 current, 53 predicted    COGNITION: Overall cognitive status: Within functional limits for tasks assessed     SENSATION: WFL   POSTURE: rounded shoulders, forward head, and decreased lumbar lordosis  PALPATION: Moderate tenderness to palpation along bilateral lumbar paraspinals, QL   LUMBAR ROM:   AROM eval  Flexion WNL, gower's sign with return to upright; reports that "going down hurts worse"   Extension 20%  Right lateral flexion 70%, min p!  Left lateral flexion 70%, min p!  Right rotation   Left rotation    (Blank rows = not tested)  LOWER EXTREMITY ROM:     Active  Right eval Left eval  Hip flexion    Hip extension    Hip abduction    Hip adduction    Hip internal rotation    Hip external rotation    Knee flexion    Knee extension    Ankle dorsiflexion    Ankle  plantarflexion    Ankle inversion    Ankle eversion     (Blank rows = not tested)  LOWER EXTREMITY MMT:    MMT Right eval Left eval  Hip flexion 4+ 4+  Hip extension    Hip abduction 5 5  Hip adduction    Hip internal rotation    Hip external rotation    Knee flexion 4+ 4+  Knee extension 4+ 4+  Ankle dorsiflexion 5 5  Ankle plantarflexion    Ankle inversion    Ankle eversion     (Blank rows = not tested)   LUMBAR SPECIAL TESTS:  Not performed   GAIT: Distance walked: 50 ft Assistive device utilized: None Level of assistance: Complete Independence Comments: slow gait speed, slight forward flexed posture.   TODAY'S TREATMENT:                                                                                                                               [facility] Adult PT Treatment:                                                DATE: 07/29/2022  Therapeutic Exercise: Supine Lower trunk rotation x 10 each side  Hooklying TA activation, 3 sec hold x 20 Supine ball squeeze, 3 sec hold x 15 Supine glute bridges, 2 x 10 with cueing for TA activation Seated clamshell  with red TB, 2 x 15  Seated March, 2 x 15 each LE Patient education regarding importance of home exercises.    PATIENT EDUCATION:  Education details: initiated and provided written home program, discussion of POC, prognosis and goals for skilled PT   Person educated: Patient via interpreter Education method: Explanation, Demonstration, and Handouts Education comprehension: verbalized understanding, returned demonstration, and needs further education  HOME EXERCISE PROGRAM: Access Code: [email or web address] URL: [email or web address] Date: 07/16/2022 Prepared by: Mauri Reading  Exercises - Supine Lower Trunk Rotation  - 1 x daily - 7 x weekly - 1 sets - 10 reps - 3 sec hold - Standing Lumbar Spine Flexion Stretch Counter  - 1 x daily - 7 x weekly - 2 sets - 10 reps - 3 sec hold - Seated Transversus  Abdominis Bracing  - 1 x daily - 7 x weekly - 2 sets - 10 reps - 5-10 sec hold - Seated March  - 1 x daily - 7 x weekly - 2 sets - 10 reps  ASSESSMENT:  CLINICAL IMPRESSION: Patient tolerated exercises well today but required occasional cueing for breathing with exercises. She reports increased Rt LE pain with LE strengthening exercises that sounds like exercise related muscle tension/soreness. Patient is expected to be more compliant with HEP following today's session to maximize rehab benefits.    OBJECTIVE IMPAIRMENTS: decreased activity tolerance, decreased endurance, decreased ROM, decreased strength, and pain.   ACTIVITY LIMITATIONS: carrying, lifting, bending, standing, stairs, and locomotion level  PARTICIPATION LIMITATIONS: meal prep, cleaning, laundry, community activity, and occupation  PERSONAL FACTORS: Past/current experiences, Profession, Time since onset of injury/illness/exacerbation, and 1 comorbidity: Relevant PMHx including Cesarean section 4 years ago, appendectomy.  are also affecting patient's functional outcome.   REHAB POTENTIAL: Good  CLINICAL DECISION MAKING: Stable/uncomplicated  EVALUATION COMPLEXITY: Low   GOALS: Goals reviewed with patient? Yes  SHORT TERM GOALS: Target date: 08/06/2022   Patient will be independent with initial home program.  Baseline: to be provided at follow up  Goal status: INITIAL     LONG TERM GOALS: Target date: 08/27/2022   Patient will report improved overall functional ability with FOTO score of 50 or greater.   Baseline: 39 current Goal status: INITIAL  2.  Patient will be able to sit for 60 minutes without exacerbation of symptoms.  Baseline: unable  Goal status: INITIAL  3.  Patient will report ability to bend and lift at least 10# from floor to counter without exacerbation of pain, using proper lifting mechanics. Baseline: unable  Goal status: INITIAL  4.  Patient will report ability to perform overhead  lifting of at least 5# or greater without exacerbation of pain.  Baseline: unable  Goal status: INITIAL  5.  Patient will demonstrate at least 60-70% AROM. Baseline: see objective measures Goal status: INITIAL    PLAN:  PT FREQUENCY: 1-2x/week  PT DURATION: 6 weeks  PLANNED INTERVENTIONS: Therapeutic exercises, Therapeutic activity, Neuromuscular re-education, Patient/Family education, Self Care, Joint mobilization, Electrical stimulation, Spinal mobilization, Cryotherapy, Moist heat, Taping, Manual therapy, and Re-evaluation.  PLAN FOR NEXT SESSION: lumbar mobility, LE strength, pain modulation, activity tolerance, manual if needed.    Mauri Reading, PT, DPT 07/29/2022, 6:26 PM

## 2022-08-05 ENCOUNTER — Ambulatory Visit: Payer: Medicaid Other

## 2022-08-05 DIAGNOSIS — M5416 Radiculopathy, lumbar region: Secondary | ICD-10-CM | POA: Diagnosis not present

## 2022-08-05 NOTE — Therapy (Signed)
OUTPATIENT PHYSICAL THERAPY THORACOLUMBAR EVALUATION   Patient Name: Elizabeth Brennan MRN: [id number] DOB:Mar 20, 1980, 42 y.o., female Today's Date: 07/15/2022  END OF SESSION:  PT End of Session - 08/05/22 1749     Visit Number 3    Number of Visits 13    Date for PT Re-Evaluation 08/27/22    Authorization Type [employer]    PT Start Time 1748    PT Stop Time 1821    PT Time Calculation (min) 33 min    Activity Tolerance Patient tolerated treatment well    Behavior During Therapy WFL for tasks assessed/performed               Past Medical History:  Diagnosis Date   Back pain    Medical history non-contributory    Past Surgical History:  Procedure Laterality Date   ANTERIOR CERVICAL DECOMP/DISCECTOMY FUSION N/A 11/08/2021   Procedure: ACDF - C4-C5 - C5-C6;  Surgeon: Julio Sicks, MD;  Location: [facility] OR;  Service: Neurosurgery;  Laterality: N/A;  3C   APPENDECTOMY     CESAREAN SECTION N/A 02/02/2018   Procedure: CESAREAN SECTION;  Surgeon: Levie Heritage, DO;  Location: [facility] BIRTHING SUITES;  Service: Obstetrics;  Laterality: N/A;   Patient Active Problem List   Diagnosis Date Noted   Cervical spondylosis with myelopathy and radiculopathy 11/08/2021   Post-dates pregnancy 02/02/2018    PCP: Harlen Labs, NP  REFERRING PROVIDER: London Sheer, MD  REFERRING DIAG: Low back pain, unspecified back pain laterality, unspecified chronicity, unspecified whether sciatica present [M54.50]   Rationale for Evaluation and Treatment: Rehabilitation  THERAPY DIAG:  Radiculopathy, lumbar region  ONSET DATE: 15+ years   SUBJECTIVE:                                                                                                                                                                                           SUBJECTIVE STATEMENT: Patient reporting decreased pain severity to 6/10 pain today. She states she is doing her exercises at home and feels that they are helpful.     PERTINENT HISTORY:  Relevant PMHx including Cesarean section 4 years ago, appendectomy.  PAIN:  Are you having pain? Yes: NPRS scale: 8/10 Pain location: lower back, R>L pain, groin, anterior thigh, posterior lower leg  Pain description: aching pain, occasional sharp/stabbing Aggravating factors: prolonged sitting, prolonged standing Relieving factors: none identified at time of eval   PRECAUTIONS: None  WEIGHT BEARING RESTRICTIONS: No  FALLS:  Has patient fallen in last 6 months? No  LIVING ENVIRONMENT: Lives with: lives with their family Lives in: House/apartment Stairs: No Has following equipment at home: None  OCCUPATION: warehouse work  PLOF: Independent  PATIENT GOALS: Patient reports that she is ready to get rid of the pain.   NEXT MD VISIT: not scheduled at time of eval   OBJECTIVE:   DIAGNOSTIC FINDINGS:  06/18/22 XR Lumbar Spine Complete   Result narrative: via referring provider's note  XR of the lumbar spine from 06/18/2022 was independently reviewed and interpreted, showing no significant degenerative changes.  No fracture or dislocation seen.  No evidence of instability on flexion/extension views.   08/05/22 MR Lumbar Spine WO Contrast   IMPRESSION: 1. Broad-based left subarticular to foraminal disc protrusion at L3-4 with resultant mild left foraminal and left lateral recess stenosis. Either the left L3 or descending L4 nerve roots could be affected. 2. Disc bulge with small central to right subarticular disc protrusion at L4-5 with resultant mild bilateral subarticular stenosis. Either of the descending L5 nerve roots could be affected, particularly on the right. 3. Mild to moderate bilateral L4 foraminal narrowing related to disc bulge, endplate spurring, and facet hypertrophy.  PATIENT SURVEYS:  FOTO 39 current, 53 predicted    COGNITION: Overall cognitive status: Within functional limits for tasks  assessed     SENSATION: WFL   POSTURE: rounded shoulders, forward head, and decreased lumbar lordosis  PALPATION: Moderate tenderness to palpation along bilateral lumbar paraspinals, QL   LUMBAR ROM:   AROM eval  Flexion WNL, gower's sign with return to upright; reports that "going down hurts worse"   Extension 20%  Right lateral flexion 70%, min p!  Left lateral flexion 70%, min p!  Right rotation   Left rotation    (Blank rows = not tested)  LOWER EXTREMITY ROM:     Active  Right eval Left eval  Hip flexion    Hip extension    Hip abduction    Hip adduction    Hip internal rotation    Hip external rotation    Knee flexion    Knee extension    Ankle dorsiflexion    Ankle plantarflexion    Ankle inversion    Ankle eversion     (Blank rows = not tested)  LOWER EXTREMITY MMT:    MMT Right eval Left eval  Hip flexion 4+ 4+  Hip extension    Hip abduction 5 5  Hip adduction    Hip internal rotation    Hip external rotation    Knee flexion 4+ 4+  Knee extension 4+ 4+  Ankle dorsiflexion 5 5  Ankle plantarflexion    Ankle inversion    Ankle eversion     (Blank rows = not tested)   LUMBAR SPECIAL TESTS:  Not performed   GAIT: Distance walked: 50 ft Assistive device utilized: None Level of assistance: Complete Independence Comments: slow gait speed, slight forward flexed posture.   TODAY'S TREATMENT:            [facility] Adult PT Treatment:                                                DATE: 08/05/2022  Therapeutic Exercise: Supine Lower trunk rotation x 15 each side  Supine ball squeeze, 3 sec hold x 15 Supine glute bridges, 2 x 12 with cueing for TA activation SLR with CL pelvic ring x 10 each side painful difficult with R SLR Seated clamshell with red TB, 2  x 15  Seated March, 2 x 15 each LE Patient education regarding importance of home exercises.                                                                                                                        [facility] Adult PT Treatment:                                                DATE: 07/29/2022  Therapeutic Exercise: Supine Lower trunk rotation x 10 each side  Hooklying TA activation, 3 sec hold x 20 Supine ball squeeze, 3 sec hold x 15 Supine glute bridges, 2 x 10 with cueing for TA activation Seated clamshell with red TB, 2 x 15  Seated March, 2 x 15 each LE Patient education regarding importance of home exercises.    PATIENT EDUCATION:  Education details: initiated and provided written home program, discussion of POC, prognosis and goals for skilled PT   Person educated: Patient via interpreter Education method: Explanation, Demonstration, and Handouts Education comprehension: verbalized understanding, returned demonstration, and needs further education  HOME EXERCISE PROGRAM: Access Code: [email or web address] URL: [email or web address] Date: 07/16/2022 Prepared by: Mauri Reading  Exercises - Supine Lower Trunk Rotation  - 1 x daily - 7 x weekly - 1 sets - 10 reps - 3 sec hold - Standing Lumbar Spine Flexion Stretch Counter  - 1 x daily - 7 x weekly - 2 sets - 10 reps - 3 sec hold - Seated Transversus Abdominis Bracing  - 1 x daily - 7 x weekly - 2 sets - 10 reps - 5-10 sec hold - Seated March  - 1 x daily - 7 x weekly - 2 sets - 10 reps  ASSESSMENT:  CLINICAL IMPRESSION: Orrie is responding well to her current program. She has most difficulty with supine glute bridges, and SLR exercise due to R LE pain and fatigue. She will benefit from additional core strengthening and lumbar flexion activities d/t presence of stenosis. We will progress her program accordingly.    OBJECTIVE IMPAIRMENTS: decreased activity tolerance, decreased endurance, decreased ROM, decreased strength, and pain.   ACTIVITY LIMITATIONS: carrying, lifting, bending, standing, stairs, and locomotion level  PARTICIPATION LIMITATIONS: meal prep, cleaning, laundry, community activity, and  occupation  PERSONAL FACTORS: Past/current experiences, Profession, Time since onset of injury/illness/exacerbation, and 1 comorbidity: Relevant PMHx including Cesarean section 4 years ago, appendectomy.  are also affecting patient's functional outcome.   REHAB POTENTIAL: Good  CLINICAL DECISION MAKING: Stable/uncomplicated  EVALUATION COMPLEXITY: Low   GOALS: Goals reviewed with patient? Yes  SHORT TERM GOALS: Target date: 08/06/2022   Patient will be independent with initial home program.  Baseline: to be provided at follow up  Goal status: INITIAL     LONG TERM GOALS: Target date: 08/27/2022   Patient will report improved overall functional ability with [facility]  score of 50 or greater.   Baseline: 39 current Goal status: INITIAL  2.  Patient will be able to sit for 60 minutes without exacerbation of symptoms.  Baseline: unable  Goal status: INITIAL  3.  Patient will report ability to bend and lift at least 10# from floor to counter without exacerbation of pain, using proper lifting mechanics. Baseline: unable  Goal status: INITIAL  4.  Patient will report ability to perform overhead lifting of at least 5# or greater without exacerbation of pain.  Baseline: unable  Goal status: INITIAL  5.  Patient will demonstrate at least 60-70% AROM. Baseline: see objective measures Goal status: INITIAL    PLAN:  PT FREQUENCY: 1-2x/week  PT DURATION: 6 weeks  PLANNED INTERVENTIONS: Therapeutic exercises, Therapeutic activity, Neuromuscular re-education, Patient/Family education, Self Care, Joint mobilization, Electrical stimulation, Spinal mobilization, Cryotherapy, Moist heat, Taping, Manual therapy, and Re-evaluation.  PLAN FOR NEXT SESSION: lumbar mobility, LE strength, pain modulation, activity tolerance, manual if needed.    Mauri Reading, PT, DPT 08/05/2022, 6:32 PM

## 2022-08-12 ENCOUNTER — Ambulatory Visit: Payer: Medicaid Other

## 2022-08-12 DIAGNOSIS — M5416 Radiculopathy, lumbar region: Secondary | ICD-10-CM

## 2022-08-12 NOTE — Therapy (Signed)
OUTPATIENT PHYSICAL THERAPY THORACOLUMBAR EVALUATION   Patient Name: Elizabeth Brennan MRN: [id number] DOB:Sep 14, 1980, 42 y.o., female Today's Date: 07/15/2022  END OF SESSION:  PT End of Session - 08/12/22 1752     Visit Number 4    Number of Visits 13    Date for PT Re-Evaluation 08/27/22    Authorization Type [facility]    PT Start Time 1750    PT Stop Time 1826    PT Time Calculation (min) 36 min    Activity Tolerance Patient tolerated treatment well    Behavior During Therapy WFL for tasks assessed/performed                Past Medical History:  Diagnosis Date   Back pain    Medical history non-contributory    Past Surgical History:  Procedure Laterality Date   ANTERIOR CERVICAL DECOMP/DISCECTOMY FUSION N/A 11/08/2021   Procedure: ACDF - C4-C5 - C5-C6;  Surgeon: Julio Sicks, MD;  Location: [facility] OR;  Service: Neurosurgery;  Laterality: N/A;  3C   APPENDECTOMY     CESAREAN SECTION N/A 02/02/2018   Procedure: CESAREAN SECTION;  Surgeon: Levie Heritage, DO;  Location: [facility] BIRTHING SUITES;  Service: Obstetrics;  Laterality: N/A;   Patient Active Problem List   Diagnosis Date Noted   Cervical spondylosis with myelopathy and radiculopathy 11/08/2021   Post-dates pregnancy 02/02/2018    PCP: Harlen Labs, NP  REFERRING PROVIDER: London Sheer, MD  REFERRING DIAG: Low back pain, unspecified back pain laterality, unspecified chronicity, unspecified whether sciatica present [M54.50]   Rationale for Evaluation and Treatment: Rehabilitation  THERAPY DIAG:  Radiculopathy, lumbar region  ONSET DATE: 15+ years   SUBJECTIVE:                                                                                                                                                                                           SUBJECTIVE STATEMENT:  Patient is reporting increased pain today 8/10 that has been flared up since yesterday. She reports that she did have a busy weekend cleaning  her kids rooms.    PERTINENT HISTORY:  Relevant PMHx including Cesarean section 4 years ago, appendectomy.  PAIN:  Are you having pain? Yes: NPRS scale: 8/10 Pain location: lower back, R>L pain, groin, anterior thigh, posterior lower leg  Pain description: aching pain, occasional sharp/stabbing Aggravating factors: prolonged sitting, prolonged standing Relieving factors: none identified at time of eval   PRECAUTIONS: None  WEIGHT BEARING RESTRICTIONS: No  FALLS:  Has patient fallen in last 6 months? No  LIVING ENVIRONMENT: Lives with: lives with their family Lives in: House/apartment Stairs: No Has  following equipment at home: None  OCCUPATION: warehouse work  PLOF: Independent  PATIENT GOALS: Patient reports that she is ready to get rid of the pain.   NEXT MD VISIT: not scheduled at time of eval   OBJECTIVE:   DIAGNOSTIC FINDINGS:  06/18/22 XR Lumbar Spine Complete   Result narrative: via referring provider's note  XR of the lumbar spine from 06/18/2022 was independently reviewed and interpreted, showing no significant degenerative changes.  No fracture or dislocation seen.  No evidence of instability on flexion/extension views.   08/05/22 MR Lumbar Spine WO Contrast   IMPRESSION: 1. Broad-based left subarticular to foraminal disc protrusion at L3-4 with resultant mild left foraminal and left lateral recess stenosis. Either the left L3 or descending L4 nerve roots could be affected. 2. Disc bulge with small central to right subarticular disc protrusion at L4-5 with resultant mild bilateral subarticular stenosis. Either of the descending L5 nerve roots could be affected, particularly on the right. 3. Mild to moderate bilateral L4 foraminal narrowing related to disc bulge, endplate spurring, and facet hypertrophy.  PATIENT SURVEYS:  FOTO 39 current, 53 predicted    COGNITION: Overall cognitive status: Within functional limits for tasks  assessed     SENSATION: WFL   POSTURE: rounded shoulders, forward head, and decreased lumbar lordosis  PALPATION: Moderate tenderness to palpation along bilateral lumbar paraspinals, QL   LUMBAR ROM:   AROM eval  Flexion WNL, gower's sign with return to upright; reports that "going down hurts worse"   Extension 20%  Right lateral flexion 70%, min p!  Left lateral flexion 70%, min p!  Right rotation   Left rotation    (Blank rows = not tested)  LOWER EXTREMITY ROM:     Active  Right eval Left eval  Hip flexion    Hip extension    Hip abduction    Hip adduction    Hip internal rotation    Hip external rotation    Knee flexion    Knee extension    Ankle dorsiflexion    Ankle plantarflexion    Ankle inversion    Ankle eversion     (Blank rows = not tested)  LOWER EXTREMITY MMT:    MMT Right eval Left eval  Hip flexion 4+ 4+  Hip extension    Hip abduction 5 5  Hip adduction    Hip internal rotation    Hip external rotation    Knee flexion 4+ 4+  Knee extension 4+ 4+  Ankle dorsiflexion 5 5  Ankle plantarflexion    Ankle inversion    Ankle eversion     (Blank rows = not tested)   LUMBAR SPECIAL TESTS:  Not performed   GAIT: Distance walked: 50 ft Assistive device utilized: None Level of assistance: Complete Independence Comments: slow gait speed, slight forward flexed posture.    TODAY'S TREATMENT:         [facility] Adult PT Treatment:                                                DATE: 08/12/2022  Therapeutic Exercise: Supine right Lower trunk rotation x 15 each side  Supine ball squeeze, 3 sec hold x 15 Supine glute bridges, x 10 with cueing for TA activation SLR with CL pelvic ring, 2 x 10 each side Seated clamshell with red TB, 2  x 15  Seated March, 2 x 15 each LE Patient education regarding activity pacing      [facility] Adult PT Treatment:                                                DATE: 08/05/2022  Therapeutic Exercise: Supine Lower  trunk rotation x 15 each side  Supine ball squeeze, 3 sec hold x 15 Supine glute bridges, 2 x 12 with cueing for TA activation SLR with CL pelvic ring x 10 each side painful difficult with R SLR Seated clamshell with red TB, 2 x 15  Seated March, 2 x 15 each LE Patient education regarding importance of home exercises.                                                                                                                       [facility] Adult PT Treatment:                                                DATE: 07/29/2022  Therapeutic Exercise: Supine Lower trunk rotation x 10 each side  Hooklying TA activation, 3 sec hold x 20 Supine ball squeeze, 3 sec hold x 15 Supine glute bridges, 2 x 10 with cueing for TA activation Seated clamshell with red TB, 2 x 15  Seated March, 2 x 15 each LE Patient education regarding importance of home exercises.    PATIENT EDUCATION:  Education details: initiated and provided written home program, discussion of POC, prognosis and goals for skilled PT   Person educated: Patient via interpreter Education method: Explanation, Demonstration, and Handouts Education comprehension: verbalized understanding, returned demonstration, and needs further education  HOME EXERCISE PROGRAM: Access Code: [email or web address] URL: [email or web address] Date: 07/16/2022 Prepared by: Mauri Reading  Exercises - Supine Lower Trunk Rotation  - 1 x daily - 7 x weekly - 1 sets - 10 reps - 3 sec hold - Standing Lumbar Spine Flexion Stretch Counter  - 1 x daily - 7 x weekly - 2 sets - 10 reps - 3 sec hold - Seated Transversus Abdominis Bracing  - 1 x daily - 7 x weekly - 2 sets - 10 reps - 5-10 sec hold - Seated March  - 1 x daily - 7 x weekly - 2 sets - 10 reps  ASSESSMENT:  CLINICAL IMPRESSION:  Erikka continues to respond well to her exercises, despite increased lumbar pain prior to today's session. She had improved tolerance of SLR today and was able to complete 2  sets without exacerbation of symptoms. Patient will benefit from increased resistance at next visit.    OBJECTIVE IMPAIRMENTS: decreased activity tolerance, decreased endurance, decreased ROM, decreased strength, and pain.   ACTIVITY LIMITATIONS:  carrying, lifting, bending, standing, stairs, and locomotion level  PARTICIPATION LIMITATIONS: meal prep, cleaning, laundry, community activity, and occupation  PERSONAL FACTORS: Past/current experiences, Profession, Time since onset of injury/illness/exacerbation, and 1 comorbidity: Relevant PMHx including Cesarean section 4 years ago, appendectomy.  are also affecting patient's functional outcome.   REHAB POTENTIAL: Good  CLINICAL DECISION MAKING: Stable/uncomplicated  EVALUATION COMPLEXITY: Low   GOALS: Goals reviewed with patient? Yes  SHORT TERM GOALS: Target date: 08/06/2022   Patient will be independent with initial home program.  Baseline: to be provided at follow up  Goal status: INITIAL     LONG TERM GOALS: Target date: 08/27/2022   Patient will report improved overall functional ability with FOTO score of 50 or greater.   Baseline: 39 current Goal status: INITIAL  2.  Patient will be able to sit for 60 minutes without exacerbation of symptoms.  Baseline: unable  Goal status: INITIAL  3.  Patient will report ability to bend and lift at least 10# from floor to counter without exacerbation of pain, using proper lifting mechanics. Baseline: unable  Goal status: INITIAL  4.  Patient will report ability to perform overhead lifting of at least 5# or greater without exacerbation of pain.  Baseline: unable  Goal status: INITIAL  5.  Patient will demonstrate at least 60-70% AROM. Baseline: see objective measures Goal status: INITIAL    PLAN:  PT FREQUENCY: 1-2x/week  PT DURATION: 6 weeks  PLANNED INTERVENTIONS: Therapeutic exercises, Therapeutic activity, Neuromuscular re-education, Patient/Family education, Self  Care, Joint mobilization, Electrical stimulation, Spinal mobilization, Cryotherapy, Moist heat, Taping, Manual therapy, and Re-evaluation.  PLAN FOR NEXT SESSION: lumbar mobility, LE strength, pain modulation, activity tolerance, manual if needed.    Mauri Reading, PT, DPT 08/12/2022, 6:30 PM

## 2022-08-19 ENCOUNTER — Ambulatory Visit: Payer: Medicaid Other | Attending: Neurosurgery

## 2022-08-19 DIAGNOSIS — M5416 Radiculopathy, lumbar region: Secondary | ICD-10-CM | POA: Diagnosis present

## 2022-08-19 NOTE — Therapy (Signed)
OUTPATIENT PHYSICAL THERAPY TREATMENT NOTE   Patient Name: Elizabeth Brennan MRN: [id number] DOB:1980-11-27, 42 y.o., female Today's Date: 07/15/2022  END OF SESSION:  PT End of Session - 08/19/22 1757     Visit Number 5    Number of Visits 13    Date for PT Re-Evaluation 08/27/22    Authorization Type [employer]    PT Start Time 1755    PT Stop Time 1830    PT Time Calculation (min) 35 min    Activity Tolerance Patient tolerated treatment well    Behavior During Therapy WFL for tasks assessed/performed                Past Medical History:  Diagnosis Date   Back pain    Medical history non-contributory    Past Surgical History:  Procedure Laterality Date   ANTERIOR CERVICAL DECOMP/DISCECTOMY FUSION N/A 11/08/2021   Procedure: ACDF - C4-C5 - C5-C6;  Surgeon: Julio Sicks, MD;  Location: [facility] OR;  Service: Neurosurgery;  Laterality: N/A;  3C   APPENDECTOMY     CESAREAN SECTION N/A 02/02/2018   Procedure: CESAREAN SECTION;  Surgeon: Levie Heritage, DO;  Location: [facility] BIRTHING SUITES;  Service: Obstetrics;  Laterality: N/A;   Patient Active Problem List   Diagnosis Date Noted   Cervical spondylosis with myelopathy and radiculopathy 11/08/2021   Post-dates pregnancy 02/02/2018    PCP: Harlen Labs, NP  REFERRING PROVIDER: London Sheer, MD  REFERRING DIAG: Low back pain, unspecified back pain laterality, unspecified chronicity, unspecified whether sciatica present [M54.50]   Rationale for Evaluation and Treatment: Rehabilitation  THERAPY DIAG:  Radiculopathy, lumbar region  ONSET DATE: 15+ years   SUBJECTIVE:                                                                                                                                                                                           SUBJECTIVE STATEMENT:  Patient reporting improved lumbar pain. She has been mostly compliant with HEP.   PERTINENT HISTORY:  Relevant PMHx including Cesarean section 4  years ago, appendectomy.  PAIN:  Are you having pain? Yes: NPRS scale: 8/10 Pain location: lower back, R>L pain, groin, anterior thigh, posterior lower leg  Pain description: aching pain, occasional sharp/stabbing Aggravating factors: prolonged sitting, prolonged standing Relieving factors: none identified at time of eval   PRECAUTIONS: None  WEIGHT BEARING RESTRICTIONS: No  FALLS:  Has patient fallen in last 6 months? No  LIVING ENVIRONMENT: Lives with: lives with their family Lives in: House/apartment Stairs: No Has following equipment at home: None  OCCUPATION: warehouse work  PLOF: Independent  PATIENT GOALS: Patient  reports that she is ready to get rid of the pain.   NEXT MD VISIT: not scheduled at time of eval   OBJECTIVE:   DIAGNOSTIC FINDINGS:  06/18/22 XR Lumbar Spine Complete   Result narrative: via referring provider's note  XR of the lumbar spine from 06/18/2022 was independently reviewed and interpreted, showing no significant degenerative changes.  No fracture or dislocation seen.  No evidence of instability on flexion/extension views.   08/05/22 MR Lumbar Spine WO Contrast   IMPRESSION: 1. Broad-based left subarticular to foraminal disc protrusion at L3-4 with resultant mild left foraminal and left lateral recess stenosis. Either the left L3 or descending L4 nerve roots could be affected. 2. Disc bulge with small central to right subarticular disc protrusion at L4-5 with resultant mild bilateral subarticular stenosis. Either of the descending L5 nerve roots could be affected, particularly on the right. 3. Mild to moderate bilateral L4 foraminal narrowing related to disc bulge, endplate spurring, and facet hypertrophy.  PATIENT SURVEYS:  FOTO 39 current, 53 predicted    COGNITION: Overall cognitive status: Within functional limits for tasks assessed     SENSATION: WFL   POSTURE: rounded shoulders, forward head, and decreased lumbar  lordosis  PALPATION: Moderate tenderness to palpation along bilateral lumbar paraspinals, QL   LUMBAR ROM:   AROM eval  Flexion WNL, gower's sign with return to upright; reports that "going down hurts worse"   Extension 20%  Right lateral flexion 70%, min p!  Left lateral flexion 70%, min p!  Right rotation   Left rotation    (Blank rows = not tested)  LOWER EXTREMITY ROM:     Active  Right eval Left eval  Hip flexion    Hip extension    Hip abduction    Hip adduction    Hip internal rotation    Hip external rotation    Knee flexion    Knee extension    Ankle dorsiflexion    Ankle plantarflexion    Ankle inversion    Ankle eversion     (Blank rows = not tested)  LOWER EXTREMITY MMT:    MMT Right eval Left eval  Hip flexion 4+ 4+  Hip extension    Hip abduction 5 5  Hip adduction    Hip internal rotation    Hip external rotation    Knee flexion 4+ 4+  Knee extension 4+ 4+  Ankle dorsiflexion 5 5  Ankle plantarflexion    Ankle inversion    Ankle eversion     (Blank rows = not tested)   LUMBAR SPECIAL TESTS:  Not performed   GAIT: Distance walked: 50 ft Assistive device utilized: None Level of assistance: Complete Independence Comments: slow gait speed, slight forward flexed posture.    TODAY'S TREATMENT:         [facility] Adult PT Treatment:                                                DATE: 08/19/2022  Therapeutic Exercise: Supine BIL Lower trunk rotation x 15 each side  Pain with L LTR Supine ball squeeze with PPT, 5 sec hold x 20 Supine glute bridges, x 10 with ball squeeze SLR with CL pelvic ring, 2 x 10 each side  SL clamshell with green TB, x 15 each Supine March with green TB, 2 x10 each LE  Supine iso abd with pball, x10, 3 sec hold   [facility] Adult PT Treatment:                                                DATE: 08/12/2022  Therapeutic Exercise: Supine right Lower trunk rotation x 15   Supine ball squeeze, 3 sec hold x 15 Supine glute  bridges, x 10 with cueing for TA activation SLR with CL pelvic ring, 2 x 10 each side  Seated clamshell with red TB, 2 x 15  Seated March, 2 x 15 each LE Patient education regarding activity pacing     PATIENT EDUCATION:  Education details: initiated and provided written home program, discussion of POC, prognosis and goals for skilled PT   Person educated: Patient via interpreter Education method: Explanation, Demonstration, and Handouts Education comprehension: verbalized understanding, returned demonstration, and needs further education  HOME EXERCISE PROGRAM: Access Code: [email or web address] URL: [email or web address] Date: 07/16/2022 Prepared by: Mauri Reading  Exercises - Supine Lower Trunk Rotation  - 1 x daily - 7 x weekly - 1 sets - 10 reps - 3 sec hold - Standing Lumbar Spine Flexion Stretch Counter  - 1 x daily - 7 x weekly - 2 sets - 10 reps - 3 sec hold - Seated Transversus Abdominis Bracing  - 1 x daily - 7 x weekly - 2 sets - 10 reps - 5-10 sec hold - Seated March  - 1 x daily - 7 x weekly - 2 sets - 10 reps  ASSESSMENT:  CLINICAL IMPRESSION:  Arabelle was able to tolerate some progression of core strengthening and hip strengthening exercises today with minimal increase in pain.  She will continue to benefit from progression of exercises as tolerated in order to reach established goals. Plan is to reassess progress towards established goals, extend POC as appropriate and provide updated home exercise program at next visit.   OBJECTIVE IMPAIRMENTS: decreased activity tolerance, decreased endurance, decreased ROM, decreased strength, and pain.   ACTIVITY LIMITATIONS: carrying, lifting, bending, standing, stairs, and locomotion level  PARTICIPATION LIMITATIONS: meal prep, cleaning, laundry, community activity, and occupation  PERSONAL FACTORS: Past/current experiences, Profession, Time since onset of injury/illness/exacerbation, and 1 comorbidity: Relevant PMHx  including Cesarean section 4 years ago, appendectomy.  are also affecting patient's functional outcome.   REHAB POTENTIAL: Good  CLINICAL DECISION MAKING: Stable/uncomplicated  EVALUATION COMPLEXITY: Low   GOALS: Goals reviewed with patient? Yes  SHORT TERM GOALS: Target date: 08/06/2022   Patient will be independent with initial home program.  Baseline: to be provided at follow up  Goal status: PARTIALLY MET   LONG TERM GOALS: Target date: 08/27/2022   Patient will report improved overall functional ability with FOTO score of 50 or greater.   Baseline: 39 current Goal status: INITIAL  2.  Patient will be able to sit for 60 minutes without exacerbation of symptoms.  Baseline: unable  Goal status: INITIAL  3.  Patient will report ability to bend and lift at least 10# from floor to counter without exacerbation of pain, using proper lifting mechanics. Baseline: unable  Goal status: INITIAL  4.  Patient will report ability to perform overhead lifting of at least 5# or greater without exacerbation of pain.  Baseline: unable  Goal status: INITIAL  5.  Patient will demonstrate at least 60-70% AROM. Baseline: see objective measures Goal status:  INITIAL    PLAN:  PT FREQUENCY: 1-2x/week  PT DURATION: 6 weeks  PLANNED INTERVENTIONS: Therapeutic exercises, Therapeutic activity, Neuromuscular re-education, Patient/Family education, Self Care, Joint mobilization, Electrical stimulation, Spinal mobilization, Cryotherapy, Moist heat, Taping, Manual therapy, and Re-evaluation.  PLAN FOR NEXT SESSION: lumbar mobility, LE strength, pain modulation, activity tolerance, manual if needed.    Mauri Reading, PT, DPT 08/19/2022, 6:35 PM

## 2022-08-26 ENCOUNTER — Ambulatory Visit: Payer: Medicaid Other

## 2022-08-26 DIAGNOSIS — M5416 Radiculopathy, lumbar region: Secondary | ICD-10-CM | POA: Diagnosis not present

## 2022-08-26 NOTE — Therapy (Addendum)
 OUTPATIENT PHYSICAL THERAPY TREATMENT NOTE  Discharge Summary Visits from Start of Care: 6  Current functional level related to goals / functional outcomes: See assessment   Remaining deficits: Current status unknown    Education / Equipment: HEP   Patient agrees to discharge. Patient goals were not met. Patient is being discharged due to not returning since the last visit.  Joneen Fresh PT, DPT, LAT, ATC  11/11/23  8:18 AM      Patient Name: Elizabeth Brennan MRN: [id number] DOB:17-Feb-1980, 42 y.o., female Today's Date: 08/26/2022   END OF SESSION:  PT End of Session - 08/26/22 1756     Visit Number 6    Number of Visits 13    Date for PT Re-Evaluation 08/27/22    Authorization Type [employer]    PT Start Time 0555    PT Stop Time 0625    PT Time Calculation (min) 30 min    Activity Tolerance Patient limited by pain    Behavior During Therapy [facility] for tasks assessed/performed                 Past Medical History:  Diagnosis Date   Back pain    Medical history non-contributory    Past Surgical History:  Procedure Laterality Date   ANTERIOR CERVICAL DECOMP/DISCECTOMY FUSION N/A 11/08/2021   Procedure: ACDF - C4-C5 - C5-C6;  Surgeon: Louis Shove, MD;  Location: [facility] OR;  Service: Neurosurgery;  Laterality: N/A;  3C   APPENDECTOMY     CESAREAN SECTION N/A 02/02/2018   Procedure: CESAREAN SECTION;  Surgeon: Barbra Lang PARAS, DO;  Location: [facility] BIRTHING SUITES;  Service: Obstetrics;  Laterality: N/A;   Patient Active Problem List   Diagnosis Date Noted   Cervical spondylosis with myelopathy and radiculopathy 11/08/2021   Post-dates pregnancy 02/02/2018    PCP: Desiree Quale, NP  REFERRING PROVIDER: Georgina Ozell LABOR, MD  REFERRING DIAG: Low back pain, unspecified back pain laterality, unspecified chronicity, unspecified whether sciatica present [M54.50]   Rationale for Evaluation and Treatment: Rehabilitation  THERAPY DIAG:  Radiculopathy, lumbar  region - Plan: PT plan of care cert/re-cert  ONSET DATE: 15+ years   SUBJECTIVE:                                                                                                                                                                                           SUBJECTIVE STATEMENT:  Patient reporting to PT with 8/10 today and states that 2 days she did not go to work, because of the pain. She was unable to do her exercises d/t pain severity.   PERTINENT HISTORY:  Relevant PMHx  including Cesarean section 4 years ago, appendectomy.  PAIN:  Are you having pain? Yes: NPRS scale: 8/10 Pain location: lower back, R>L pain, groin, anterior thigh, posterior lower leg  Pain description: aching pain, occasional sharp/stabbing Aggravating factors: prolonged sitting, prolonged standing Relieving factors: none identified at time of eval   PRECAUTIONS: None  WEIGHT BEARING RESTRICTIONS: No  FALLS:  Has patient fallen in last 6 months? No  LIVING ENVIRONMENT: Lives with: lives with their family Lives in: House/apartment Stairs: No Has following equipment at home: None  OCCUPATION: warehouse work  PLOF: Independent  PATIENT GOALS: Patient reports that she is ready to get rid of the pain.   NEXT MD VISIT: not scheduled at time of eval   OBJECTIVE:   DIAGNOSTIC FINDINGS:  06/18/22 XR Lumbar Spine Complete   Result narrative: via referring provider's note  XR of the lumbar spine from 06/18/2022 was independently reviewed and interpreted, showing no significant degenerative changes.  No fracture or dislocation seen.  No evidence of instability on flexion/extension views.   08/05/22 MR Lumbar Spine WO Contrast   IMPRESSION: 1. Broad-based left subarticular to foraminal disc protrusion at L3-4 with resultant mild left foraminal and left lateral recess stenosis. Either the left L3 or descending L4 nerve roots could be affected. 2. Disc bulge with small central to right subarticular  disc protrusion at L4-5 with resultant mild bilateral subarticular stenosis. Either of the descending L5 nerve roots could be affected, particularly on the right. 3. Mild to moderate bilateral L4 foraminal narrowing related to disc bulge, endplate spurring, and facet hypertrophy.  PATIENT SURVEYS:  FOTO 39 current, 53 predicted    COGNITION: Overall cognitive status: Within functional limits for tasks assessed     SENSATION: WFL   POSTURE: rounded shoulders, forward head, and decreased lumbar lordosis  PALPATION: Moderate tenderness to palpation along bilateral lumbar paraspinals, QL   LUMBAR ROM:   AROM eval  Flexion WNL, gower's sign with return to upright; reports that going down hurts worse   Extension 20%  Right lateral flexion 70%, min p!  Left lateral flexion 70%, min p!  Right rotation   Left rotation    (Blank rows = not tested)  LOWER EXTREMITY ROM:     Active  Right eval Left eval  Hip flexion    Hip extension    Hip abduction    Hip adduction    Hip internal rotation    Hip external rotation    Knee flexion    Knee extension    Ankle dorsiflexion    Ankle plantarflexion    Ankle inversion    Ankle eversion     (Blank rows = not tested)  LOWER EXTREMITY MMT:    MMT Right eval Left eval  Hip flexion 4+ 4+  Hip extension    Hip abduction 5 5  Hip adduction    Hip internal rotation    Hip external rotation    Knee flexion 4+ 4+  Knee extension 4+ 4+  Ankle dorsiflexion 5 5  Ankle plantarflexion    Ankle inversion    Ankle eversion     (Blank rows = not tested)   LUMBAR SPECIAL TESTS:  Not performed   GAIT: Distance walked: 50 ft Assistive device utilized: None Level of assistance: Complete Independence Comments: slow gait speed, slight forward flexed posture.    TODAY'S TREATMENT:         [facility] Adult PT Treatment:  DATE: 08/26/2022  Therapeutic Exercise: Seated pball roll  Lt/Rt, 2 x 10 each  Supine Rt Lower trunk rotation x 10 each side  Supine ball squeeze with PPT, 5 sec hold x 20 SL clamshell with red TB, x 10 each Supine March with green TB, 2 x10 each LE  Therapeutic Activity:  assessment of subjective report regarding progress towards established goals   [facility] Adult PT Treatment:                                                DATE: 08/19/2022  Therapeutic Exercise: Supine BIL Lower trunk rotation x 15 each side  Pain with L LTR Supine ball squeeze with PPT, 5 sec hold x 20 Supine glute bridges, x 10 with ball squeeze SLR with CL pelvic ring, 2 x 10 each side  SL clamshell with green TB, x 15 each Supine March with green TB, 2 x10 each LE Supine iso abd with pball, x10, 3 sec hold   [facility] Adult PT Treatment:                                                DATE: 08/12/2022  Therapeutic Exercise: Supine right Lower trunk rotation x 15   Supine ball squeeze, 3 sec hold x 15 Supine glute bridges, x 10 with cueing for TA activation SLR with CL pelvic ring, 2 x 10 each side  Seated clamshell with red TB, 2 x 15  Seated March, 2 x 15 each LE Patient education regarding activity pacing     PATIENT EDUCATION:  Education details: initiated and provided written home program, discussion of POC, prognosis and goals for skilled PT   Person educated: Patient via interpreter Education method: Explanation, Demonstration, and Handouts Education comprehension: verbalized understanding, returned demonstration, and needs further education  HOME EXERCISE PROGRAM: Access Code: [email or web address] URL: [email or web address] Date: 07/16/2022 Prepared by: Marko Molt  Exercises - Supine Lower Trunk Rotation  - 1 x daily - 7 x weekly - 1 sets - 10 reps - 3 sec hold - Standing Lumbar Spine Flexion Stretch Counter  - 1 x daily - 7 x weekly - 2 sets - 10 reps - 3 sec hold - Seated Transversus Abdominis Bracing  - 1 x daily - 7 x weekly - 2 sets - 10 reps - 5-10  sec hold - Seated March  - 1 x daily - 7 x weekly - 2 sets - 10 reps  ASSESSMENT:  CLINICAL IMPRESSION:  Kayln has increased pain at start of physical therapy today.  She notes slight improvement from 8/10 to 7/10 at end of treatment session.  She responded well to seated pball rolling.  She is demonstrating some progress towards established functional goals, and would benefit from ongoing skilled physical therapy at frequency of 1 times a week for 4 weeks.  We will continue with pain modulation activities, lumbar mobility, lower extremity strengthening, core strengthening in order to maximize overall function.   OBJECTIVE IMPAIRMENTS: decreased activity tolerance, decreased endurance, decreased ROM, decreased strength, and pain.   ACTIVITY LIMITATIONS: carrying, lifting, bending, standing, stairs, and locomotion level  PARTICIPATION LIMITATIONS: meal prep, cleaning, laundry, community activity, and occupation  PERSONAL FACTORS: Past/current experiences, Profession, Time since onset  of injury/illness/exacerbation, and 1 comorbidity: Relevant PMHx including Cesarean section 4 years ago, appendectomy. are also affecting patient's functional outcome.   REHAB POTENTIAL: Good  CLINICAL DECISION MAKING: Stable/uncomplicated  EVALUATION COMPLEXITY: Low   GOALS: Goals reviewed with patient? Yes  SHORT TERM GOALS: Target date: 08/06/2022   Patient will be independent with initial home program.  Baseline: to be provided at follow up  Goal status: MET   LONG TERM GOALS: Target date: 09/23/2022   Patient will report improved overall functional ability with FOTO score of 50 or greater.   Baseline: 39 current Goal status: Ongoing  2.  Patient will be able to sit for 60 minutes without exacerbation of symptoms.  Baseline: unable  08/26/22: 60-90 minutes  Goal status: MET  3.  Patient will report ability to bend and lift at least 10# from floor to counter without exacerbation of pain,  using proper lifting mechanics. Baseline: unable  08/26/22: unable to tolerate bending for even light objects  Goal status: Ongoing  4.  Patient will report ability to perform overhead lifting of at least 5# or greater without exacerbation of pain.  Baseline: unable  08/26/22: able to perform light overhead lifting >2# Goal status: Ongoing  5.  Patient will demonstrate at least 60-70% AROM. Baseline: see objective measures Goal status: Ongoing    PLAN:  PT FREQUENCY: 1-2x/week  PT DURATION: 4 weeks, updated as of 08/26/2022  PLANNED INTERVENTIONS: Therapeutic exercises, Therapeutic activity, Neuromuscular re-education, Patient/Family education, Self Care, Joint mobilization, Electrical stimulation, Spinal mobilization, Cryotherapy, Moist heat, Taping, Manual therapy, and Re-evaluation.  PLAN FOR NEXT SESSION: lumbar mobility, LE strength, pain modulation, activity tolerance, manual if needed.    Marko Molt, PT, DPT 08/26/2022, 6:44 PM

## 2022-09-03 ENCOUNTER — Ambulatory Visit: Payer: Medicaid Other

## 2022-09-09 ENCOUNTER — Ambulatory Visit: Payer: Medicaid Other

## 2022-09-18 ENCOUNTER — Ambulatory Visit: Payer: Medicaid Other

## 2022-09-23 ENCOUNTER — Ambulatory Visit: Payer: Medicaid Other

## 2023-05-22 ENCOUNTER — Other Ambulatory Visit: Payer: Self-pay | Admitting: Neurosurgery

## 2023-05-22 DIAGNOSIS — M5416 Radiculopathy, lumbar region: Secondary | ICD-10-CM

## 2023-06-05 ENCOUNTER — Other Ambulatory Visit

## 2023-06-06 ENCOUNTER — Ambulatory Visit
Admission: RE | Admit: 2023-06-06 | Discharge: 2023-06-06 | Disposition: A | Discharge status: Home / Self Care | Source: Ambulatory Visit | Attending: Neurosurgery | Admitting: Neurosurgery

## 2023-06-06 DIAGNOSIS — M5416 Radiculopathy, lumbar region: Secondary | ICD-10-CM

## 2023-12-19 ENCOUNTER — Encounter (HOSPITAL_COMMUNITY): Payer: Self-pay

## 2023-12-19 ENCOUNTER — Ambulatory Visit (HOSPITAL_COMMUNITY)
Admission: EM | Admit: 2023-12-19 | Discharge: 2023-12-19 | Disposition: A | Discharge status: Home / Self Care | Payer: Self-pay

## 2023-12-19 DIAGNOSIS — M545 Low back pain, unspecified: Secondary | ICD-10-CM

## 2023-12-19 DIAGNOSIS — K2990 Gastroduodenitis, unspecified, without bleeding: Secondary | ICD-10-CM

## 2023-12-19 DIAGNOSIS — G8929 Other chronic pain: Secondary | ICD-10-CM

## 2023-12-19 DIAGNOSIS — K2970 Gastritis, unspecified, without bleeding: Secondary | ICD-10-CM

## 2023-12-19 LAB — POCT URINE DIPSTICK
Bilirubin, UA: NEGATIVE
Glucose, UA: NEGATIVE mg/dL
Ketones, POC UA: NEGATIVE mg/dL
Nitrite, UA: NEGATIVE
POC PROTEIN,UA: NEGATIVE
Spec Grav, UA: 1.015 (ref 1.010–1.025)
Urobilinogen, UA: 0.2 U/dL
pH, UA: 5.5 (ref 5.0–8.0)

## 2023-12-19 MED ORDER — METHOCARBAMOL 500 MG PO TABS: 500.0000 mg | ORAL_TABLET | Freq: Two times a day (BID) | ORAL | 0 refills | Status: AC

## 2023-12-19 MED ORDER — OMEPRAZOLE 40 MG PO CPDR: 40.0000 mg | DELAYED_RELEASE_CAPSULE | Freq: Every day | ORAL | 0 refills | Status: AC

## 2023-12-19 NOTE — Discharge Instructions (Addendum)
  What is Gastritis? Gastritis is inflammation of the stomach lining. NSAIDs (nonsteroidal anti-inflammatory drugs) such as ibuprofen  and naproxen can irritate the stomach lining, especially with frequent or high-dose use, leading to symptoms like upper abdominal pain, nausea, bloating, and sometimes vomiting or loss of appetite.  Supportive Management Stop NSAIDs: Discontinue NSAIDs immediately if possible. If pain control is needed, consider alternatives such as acetaminophen  (if not contraindicated). Rest the Stomach: Eat smaller, more frequent meals. Avoid eating late at night. Hydration: Drink plenty of fluids, but avoid caffeinated, carbonated, or alcoholic beverages.  Medications Omeprazole  (Proton Pump Inhibitor): More potent acid suppression, often preferred for moderate to severe symptoms. Take once daily, ideally 30-60 minutes before the first meal. Typical course is 2-4 weeks, but duration may vary based on severity.  Diet and Lifestyle Changes Avoid: Spicy foods, acidic foods (tomatoes, citrus), fried/fatty foods, caffeine, alcohol, and smoking. Choose: Bland, non-irritating foods such as bananas, rice, applesauce, toast, oatmeal, lean meats, and cooked vegetables. Eat Slowly: Chew food thoroughly and avoid overeating.  When to Seek Medical Care Advise the patient to seek prompt medical attention if any of the following occur: Vomiting blood or material that looks like coffee grounds Black, tarry stools Severe or worsening abdominal pain Unintentional weight loss Persistent vomiting or inability to keep fluids down Signs of anemia (fatigue, pallor, shortness of breath)

## 2023-12-19 NOTE — ED Provider Notes (Signed)
 [facility]    CSN: [id number] Arrival date & time: 12/19/23  1757      History   Chief Complaint Chief Complaint  Patient presents with   Abdominal Pain   Back Pain    HPI Elizabeth Brennan is a 43 y.o. female.   Patient presents today with complaint of left upper abdominal pain that began 2 weeks ago and acutely worsened to 3 days ago.  She reports that she has a constant abdominal discomfort and occasional stabbing pains that she rates as 8 out of 10.  Pain does not radiate.  She reports the pain is not worsened by movement or eating.  She reports that appetite has been unchanged and denies nausea, vomiting, change in bowel movements, midsternal chest pain, burping, sore throat, and coughing.  She denies any changes in diet and medication but admits that she takes ibuprofen  twice daily every day for her chronic back and neck pain which is chronic.  She has not had a similar issue in the past.  The history is provided by the patient.  Abdominal Pain Pain location:  Epigastric and LUQ Pain quality: sharp   Pain radiates to:  Does not radiate Pain severity:  Moderate Timing:  Intermittent Progression:  Waxing and waning Chronicity:  New Context: not alcohol use, not awakening from sleep, not diet changes, not eating, not laxative use, not medication withdrawal, not previous surgeries, not recent illness, not recent sexual activity and not recent travel   Relieved by:  None tried Worsened by:  Nothing Ineffective treatments:  None tried Associated symptoms: no anorexia, no belching, no chest pain, no chills, no constipation, no cough, no diarrhea, no dysuria, no fatigue, no fever, no hematemesis, no nausea, no sore throat and no vomiting   Back Pain Associated symptoms: abdominal pain   Associated symptoms: no chest pain, no dysuria and no fever     Past Medical History:  Diagnosis Date   Back pain    Medical history non-contributory     Patient Active Problem List    Diagnosis Date Noted   Cervical spondylosis with myelopathy and radiculopathy 11/08/2021   Post-dates pregnancy 02/02/2018    Past Surgical History:  Procedure Laterality Date   ANTERIOR CERVICAL DECOMP/DISCECTOMY FUSION N/A 11/08/2021   Procedure: ACDF - C4-C5 - C5-C6;  Surgeon: Louis Shove, MD;  Location: [facility];  Service: Neurosurgery;  Laterality: N/A;  3C   APPENDECTOMY     CESAREAN SECTION N/A 02/02/2018   Procedure: CESAREAN SECTION;  Surgeon: Barbra Lang PARAS, DO;  Location: [facility]IR[facility];  Service: Obstetrics;  Laterality: N/A;    OB History     Gravida  5   Para  5   Term  5   Preterm      AB      Living  5      SAB      IAB      Ectopic      Multiple  0   Live Births  1            Home Medications    Prior to Admission medications   Medication Sig Start Date End Date Taking? Authorizing Provider  methocarbamol  (ROBAXIN ) 500 MG tablet Take 1 tablet (500 mg total) by mouth 2 (two) times daily. 12/19/23  Yes Leatrice Vernell HERO, NP  omeprazole  (PRILOSEC) 40 MG capsule Take 1 capsule (40 mg total) by mouth daily. 12/19/23  Yes Leatrice Vernell HERO, NP  cyclobenzaprine  (FLEXERIL ) 10  MG tablet Take 1 tablet (10 mg total) by mouth 3 (three) times daily as needed for muscle spasms. Patient not taking: Reported on 07/15/2022 11/09/21   Cheryle Debby LABOR, MD  HYDROcodone -acetaminophen  (NORCO/VICODIN) 5-325 MG tablet Take 1 tablet by mouth every 4 (four) hours as needed (pain). Patient not taking: Reported on 07/15/2022 11/09/21   Cheryle Debby LABOR, MD  ibuprofen  (ADVIL ) 200 MG tablet Take 200 mg by mouth every 6 (six) hours as needed for moderate pain. Pt states she takes up to 1,000mg  over the course of the day.    [provider]    Family History History reviewed. No pertinent family history.  Social History Social History   Tobacco Use   Smoking status: Never   Smokeless tobacco: Never  Vaping Use   Vaping status: Never Used   Substance Use Topics   Alcohol use: No   Drug use: No     Allergies   Porcine (pork) protein-containing drug products   Review of Systems Review of Systems  Constitutional:  Negative for chills, fatigue and fever.  HENT:  Negative for sore throat.   Respiratory:  Negative for cough.   Cardiovascular:  Negative for chest pain.  Gastrointestinal:  Positive for abdominal pain. Negative for anorexia, constipation, diarrhea, hematemesis, nausea and vomiting.  Genitourinary:  Negative for dysuria.  Musculoskeletal:  Positive for back pain.     Physical Exam Triage Vital Signs ED Triage Vitals [12/19/23 1812]  Encounter Vitals Group     BP 116/81     Girls Systolic BP Percentile      Girls Diastolic BP Percentile      Boys Systolic BP Percentile      Boys Diastolic BP Percentile      Pulse Rate 74     Resp 17     Temp 98.2 F (36.8 C)     Temp src      SpO2 99 %     Weight      Height      Head Circumference      Peak Flow      Pain Score 8     Pain Loc      Pain Education      Exclude from Growth Chart    No data found.  Updated Vital Signs BP 116/81   Pulse 74   Temp 98.2 F (36.8 C)   Resp 17   LMP 12/09/2023 (Exact Date)   SpO2 99%   Visual Acuity Right Eye Distance:   Left Eye Distance:   Bilateral Distance:    Right Eye Near:   Left Eye Near:    Bilateral Near:     Physical Exam Vitals and nursing note reviewed.  Constitutional:      General: She is not in acute distress.    Appearance: Normal appearance. She is normal weight. She is not toxic-appearing.  Eyes:     Conjunctiva/sclera: Conjunctivae normal.  Cardiovascular:     Rate and Rhythm: Normal rate and regular rhythm.     Heart sounds: Normal heart sounds.  Pulmonary:     Effort: Pulmonary effort is normal.     Breath sounds: Normal breath sounds and air entry.  Abdominal:     General: Abdomen is flat. Bowel sounds are normal.     Palpations: Abdomen is soft. There is no mass or  pulsatile mass.     Tenderness: There is abdominal tenderness in the epigastric area and left upper quadrant. There is no  right CVA tenderness, left CVA tenderness, guarding or rebound. Negative signs include Murphy's sign, Rovsing's sign and McBurney's sign.     Hernia: No hernia is present.  Lymphadenopathy:     Cervical:     Right cervical: No posterior cervical adenopathy.    Left cervical: No posterior cervical adenopathy.  Skin:    General: Skin is warm and dry.  Neurological:     Mental Status: She is alert and oriented to person, place, and time.  Psychiatric:        Mood and Affect: Mood normal.        Behavior: Behavior normal.      UC Treatments / Results  Labs (all labs ordered are listed, but only abnormal results are displayed) Labs Reviewed  POCT URINE DIPSTICK - Abnormal; Notable for the following components:      Result Value   Clarity, UA turbid (*)    Blood, UA small (*)    Leukocytes, UA Trace (*)    All other components within normal limits    EKG   Radiology No results found.  Procedures Procedures (including critical care time)  Medications Ordered in UC Medications - No data to display  Initial Impression / Assessment and Plan / UC Course  I have reviewed the triage vital signs and the nursing notes.  Pertinent labs & imaging results that were available during my care of the patient were reviewed by me and considered in my medical decision making (see chart for details).     Given daily chronic NSAID use localized to the epigastric and left upper quadrant areas I suspect that the patient has a gastritis secondary to NSAID use.  We had a discussion on stopping ibuprofen  and starting omeprazole  daily.  Patient educated that chronic overuse of NSAIDs can lead to gastritis as well as stomach ulcers and peptic ulcer disease as.  She is fearful about stopping the ibuprofen  as she has not found anything else that works for her back pain.  To avoid  continued ibuprofen  use at home as well as pain exacerbations I am also prescribing Robaxin  that she may take as needed for muscle spasms. Low concern for gallbladder or appendix etiology as patient does not have any right sided abdominal tenderness.  Murphy's test is negative. Final Clinical Impressions(s) / UC Diagnoses   Final diagnoses:  Gastritis and gastroduodenitis  Chronic low back pain without sciatica, unspecified back pain laterality     Discharge Instructions       What is Gastritis? Gastritis is inflammation of the stomach lining. NSAIDs (nonsteroidal anti-inflammatory drugs) such as ibuprofen  and naproxen can irritate the stomach lining, especially with frequent or high-dose use, leading to symptoms like upper abdominal pain, nausea, bloating, and sometimes vomiting or loss of appetite.  Supportive Management Stop NSAIDs: Discontinue NSAIDs immediately if possible. If pain control is needed, consider alternatives such as acetaminophen  (if not contraindicated). Rest the Stomach: Eat smaller, more frequent meals. Avoid eating late at night. Hydration: Drink plenty of fluids, but avoid caffeinated, carbonated, or alcoholic beverages.  Medications Omeprazole  (Proton Pump Inhibitor): More potent acid suppression, often preferred for moderate to severe symptoms. Take once daily, ideally 30-60 minutes before the first meal. Typical course is 2-4 weeks, but duration may vary based on severity.  Diet and Lifestyle Changes Avoid: Spicy foods, acidic foods (tomatoes, citrus), fried/fatty foods, caffeine, alcohol, and smoking. Choose: Bland, non-irritating foods such as bananas, rice, applesauce, toast, oatmeal, lean meats, and cooked vegetables. Eat Slowly: [place]  food thoroughly and avoid overeating.  When to Seek Medical Care Advise the patient to seek prompt medical attention if any of the following occur: Vomiting blood or material that looks like coffee grounds Black, tarry  stools Severe or worsening abdominal pain Unintentional weight loss Persistent vomiting or inability to keep fluids down Signs of anemia (fatigue, pallor, shortness of breath)        ED Prescriptions     Medication Sig Dispense Auth. Provider   omeprazole  (PRILOSEC) 40 MG capsule Take 1 capsule (40 mg total) by mouth daily. 14 capsule Leatrice Vernell HERO, NP   methocarbamol  (ROBAXIN ) 500 MG tablet Take 1 tablet (500 mg total) by mouth 2 (two) times daily. 20 tablet Leatrice Vernell HERO, NP      PDMP not reviewed this encounter.   Leatrice Vernell HERO, NP 12/19/23 1943

## 2023-12-19 NOTE — ED Triage Notes (Signed)
 Pt present with c/o  abdominal pain x  2 weeks. Pt reports the pain worsened 3 days ago and having back pain. Pt denies diarrhea and urinary symptoms.

## 2023-12-22 ENCOUNTER — Encounter (HOSPITAL_COMMUNITY): Payer: Self-pay | Admitting: *Deleted

## 2023-12-22 ENCOUNTER — Other Ambulatory Visit: Payer: Self-pay

## 2023-12-22 ENCOUNTER — Emergency Department (HOSPITAL_COMMUNITY)
Admission: EM | Admit: 2023-12-22 | Discharge: 2023-12-22 | Discharge status: Against Medical Advice | Payer: Self-pay | Attending: Emergency Medicine | Admitting: Emergency Medicine

## 2023-12-22 LAB — URINALYSIS, ROUTINE W REFLEX MICROSCOPIC
Glucose, UA: NEGATIVE mg/dL
Ketones, ur: NEGATIVE mg/dL
Leukocytes,Ua: NEGATIVE
Nitrite: NEGATIVE
Protein, ur: 30 mg/dL — AB
Specific Gravity, Urine: 1.028 (ref 1.005–1.030)
pH: 5 (ref 5.0–8.0)

## 2023-12-22 LAB — LIPASE, BLOOD: Lipase: 27 U/L (ref 11–51)

## 2023-12-22 LAB — CBC
HCT: 41.9 % (ref 36.0–46.0)
Hemoglobin: 13.8 g/dL (ref 12.0–15.0)
MCH: 28.8 pg (ref 26.0–34.0)
MCHC: 32.9 g/dL (ref 30.0–36.0)
MCV: 87.5 fL (ref 80.0–100.0)
Platelets: 254 K/uL (ref 150–400)
RBC: 4.79 MIL/uL (ref 3.87–5.11)
RDW: 12.8 % (ref 11.5–15.5)
WBC: 7.5 K/uL (ref 4.0–10.5)
nRBC: 0 % (ref 0.0–0.2)

## 2023-12-22 LAB — COMPREHENSIVE METABOLIC PANEL WITH GFR
ALT: 11 U/L (ref 0–44)
AST: 16 U/L (ref 15–41)
Albumin: 3.3 g/dL — ABNORMAL LOW (ref 3.5–5.0)
Alkaline Phosphatase: 59 U/L (ref 38–126)
Anion gap: 11 (ref 5–15)
BUN: 8 mg/dL (ref 6–20)
CO2: 21 mmol/L — ABNORMAL LOW (ref 22–32)
Calcium: 9.2 mg/dL (ref 8.9–10.3)
Chloride: 104 mmol/L (ref 98–111)
Creatinine, Ser: 0.69 mg/dL (ref 0.44–1.00)
GFR, Estimated: >60 mL/min (ref >60)
Glucose, Bld: 99 mg/dL (ref 70–99)
Potassium: 3.7 mmol/L (ref 3.5–5.1)
Sodium: 136 mmol/L (ref 135–145)
Total Bilirubin: 0.6 mg/dL (ref 0.0–1.2)
Total Protein: 7.7 g/dL (ref 6.5–8.1)

## 2023-12-22 LAB — HCG, SERUM, QUALITATIVE: Preg, Serum: NEGATIVE

## 2023-12-22 MED ORDER — ONDANSETRON 4 MG PO TBDP
4.0000 mg | ORAL_TABLET | Freq: Once | ORAL | Status: AC
  Administered 2023-12-22: 4 mg via ORAL
  Filled 2023-12-22: qty 1

## 2023-12-22 MED ORDER — OXYCODONE HCL 5 MG PO TABS
5.0000 mg | ORAL_TABLET | Freq: Once | ORAL | Status: AC
  Administered 2023-12-22: 5 mg via ORAL
  Filled 2023-12-22: qty 1

## 2023-12-22 NOTE — ED Triage Notes (Signed)
 Pt c/o abdominal pain that's been ongoing for 2 weeks. Pain has increased in the past few days.

## 2023-12-22 NOTE — ED Notes (Signed)
 Called PT for vitals check 3 times and no answer.Elizabeth Brennan

## 2023-12-22 NOTE — ED Triage Notes (Signed)
 Pt has had abdominal pain x3 weeks which has been getting worse.  Pt describes this as generalized and at times more in right upper quadrant.  Pt has had nausea and vomiting with this as well as pain and burning with urination.  LBM was yesterday.

## 2023-12-22 NOTE — ED Notes (Signed)
 Called PT three times for vitals check and no answer.SABRASABRA

## 2023-12-22 NOTE — ED Notes (Signed)
 Pt speaks arabic and will require an interpreter

## 2023-12-22 NOTE — ED Notes (Signed)
 PT left AMA

## 2023-12-23 ENCOUNTER — Emergency Department (HOSPITAL_COMMUNITY): Payer: Self-pay | Admitting: Anesthesiology

## 2023-12-23 ENCOUNTER — Encounter (HOSPITAL_BASED_OUTPATIENT_CLINIC_OR_DEPARTMENT_OTHER): Payer: Self-pay | Admitting: Emergency Medicine

## 2023-12-23 ENCOUNTER — Emergency Department (HOSPITAL_BASED_OUTPATIENT_CLINIC_OR_DEPARTMENT_OTHER): Payer: Self-pay

## 2023-12-23 ENCOUNTER — Encounter (HOSPITAL_COMMUNITY)
Admission: EM | Disposition: A | Discharge status: Home / Self Care | Payer: Self-pay | Source: Home / Self Care | Attending: Emergency Medicine

## 2023-12-23 ENCOUNTER — Ambulatory Visit (HOSPITAL_BASED_OUTPATIENT_CLINIC_OR_DEPARTMENT_OTHER)
Admission: EM | Admit: 2023-12-23 | Discharge: 2023-12-23 | Disposition: A | Discharge status: Home / Self Care | Payer: Self-pay | Attending: Emergency Medicine | Admitting: Emergency Medicine

## 2023-12-23 ENCOUNTER — Other Ambulatory Visit: Payer: Self-pay

## 2023-12-23 DIAGNOSIS — K819 Cholecystitis, unspecified: Secondary | ICD-10-CM

## 2023-12-23 HISTORY — PX: CHOLECYSTECTOMY: SHX55

## 2023-12-23 LAB — CBC WITH DIFFERENTIAL/PLATELET
Abs Immature Granulocytes: 0.01 K/uL (ref 0.00–0.07)
Basophils Absolute: 0 K/uL (ref 0.0–0.1)
Basophils Relative: 0 %
Eosinophils Absolute: 0.1 K/uL (ref 0.0–0.5)
Eosinophils Relative: 1 %
HCT: 41.4 % (ref 36.0–46.0)
Hemoglobin: 13.6 g/dL (ref 12.0–15.0)
Immature Granulocytes: 0 %
Lymphocytes Relative: 27 %
Lymphs Abs: 1.9 K/uL (ref 0.7–4.0)
MCH: 28 pg (ref 26.0–34.0)
MCHC: 32.9 g/dL (ref 30.0–36.0)
MCV: 85.2 fL (ref 80.0–100.0)
Monocytes Absolute: 0.5 K/uL (ref 0.1–1.0)
Monocytes Relative: 8 %
Neutro Abs: 4.5 K/uL (ref 1.7–7.7)
Neutrophils Relative %: 64 %
Platelets: 222 K/uL (ref 150–400)
RBC: 4.86 MIL/uL (ref 3.87–5.11)
RDW: 12.6 % (ref 11.5–15.5)
WBC: 7 K/uL (ref 4.0–10.5)
nRBC: 0 % (ref 0.0–0.2)

## 2023-12-23 LAB — COMPREHENSIVE METABOLIC PANEL WITH GFR
ALT: 7 U/L (ref 0–44)
AST: 14 U/L — ABNORMAL LOW (ref 15–41)
Albumin: 4 g/dL (ref 3.5–5.0)
Alkaline Phosphatase: 67 U/L (ref 38–126)
Anion gap: 11 (ref 5–15)
BUN: 11 mg/dL (ref 6–20)
CO2: 24 mmol/L (ref 22–32)
Calcium: 9.2 mg/dL (ref 8.9–10.3)
Chloride: 102 mmol/L (ref 98–111)
Creatinine, Ser: 0.63 mg/dL (ref 0.44–1.00)
GFR, Estimated: >60 mL/min (ref >60)
Glucose, Bld: 100 mg/dL — ABNORMAL HIGH (ref 70–99)
Potassium: 3.8 mmol/L (ref 3.5–5.1)
Sodium: 136 mmol/L (ref 135–145)
Total Bilirubin: 0.5 mg/dL (ref 0.0–1.2)
Total Protein: 7.9 g/dL (ref 6.5–8.1)

## 2023-12-23 LAB — LIPASE, BLOOD: Lipase: 18 U/L (ref 11–51)

## 2023-12-23 SURGERY — LAPAROSCOPIC CHOLECYSTECTOMY
Anesthesia: General | Site: Abdomen

## 2023-12-23 MED ORDER — MIDAZOLAM HCL 5 MG/5ML IJ SOLN
INTRAMUSCULAR | Status: DC | PRN
  Administered 2023-12-23: 2 mg via INTRAVENOUS

## 2023-12-23 MED ORDER — LIDOCAINE HCL (PF) 2 % IJ SOLN
INTRAMUSCULAR | Status: AC
  Filled 2023-12-23: qty 5

## 2023-12-23 MED ORDER — FENTANYL CITRATE (PF) 100 MCG/2ML IJ SOLN
INTRAMUSCULAR | Status: DC | PRN
  Administered 2023-12-23: 100 ug via INTRAVENOUS

## 2023-12-23 MED ORDER — LIDOCAINE HCL (PF) 2 % IJ SOLN
INTRAMUSCULAR | Status: DC | PRN
  Administered 2023-12-23: 100 mg via INTRADERMAL

## 2023-12-23 MED ORDER — PHENYLEPHRINE 80 MCG/ML (10ML) SYRINGE FOR IV PUSH (FOR BLOOD PRESSURE SUPPORT)
PREFILLED_SYRINGE | INTRAVENOUS | Status: AC
  Filled 2023-12-23: qty 10

## 2023-12-23 MED ORDER — FENTANYL CITRATE (PF) 100 MCG/2ML IJ SOLN
INTRAMUSCULAR | Status: AC
  Filled 2023-12-23: qty 2

## 2023-12-23 MED ORDER — SODIUM CHLORIDE 0.9 % IV SOLN
2.0000 g | Freq: Once | INTRAVENOUS | Status: AC
  Administered 2023-12-23: 2 g via INTRAVENOUS
  Filled 2023-12-23: qty 20

## 2023-12-23 MED ORDER — OXYCODONE HCL 5 MG PO TABS: 5.0000 mg | ORAL_TABLET | Freq: Four times a day (QID) | ORAL | 0 refills | Status: DC | PRN

## 2023-12-23 MED ORDER — BUPIVACAINE-EPINEPHRINE 0.25% -1:200000 IJ SOLN
INTRAMUSCULAR | Status: DC | PRN
  Administered 2023-12-23: 30 mL

## 2023-12-23 MED ORDER — SUGAMMADEX SODIUM 200 MG/2ML IV SOLN
INTRAVENOUS | Status: AC
  Filled 2023-12-23: qty 2

## 2023-12-23 MED ORDER — ONDANSETRON HCL 4 MG/2ML IJ SOLN
INTRAMUSCULAR | Status: DC | PRN
  Administered 2023-12-23: 4 mg via INTRAVENOUS

## 2023-12-23 MED ORDER — FENTANYL CITRATE (PF) 50 MCG/ML IJ SOSY
25.0000 ug | PREFILLED_SYRINGE | INTRAMUSCULAR | Status: DC | PRN
  Administered 2023-12-23 (×2): 25 ug via INTRAVENOUS

## 2023-12-23 MED ORDER — LACTATED RINGERS IR SOLN
Status: DC | PRN
  Administered 2023-12-23: 1

## 2023-12-23 MED ORDER — ACETAMINOPHEN 500 MG PO TABS
1000.0000 mg | ORAL_TABLET | ORAL | Status: AC
  Administered 2023-12-23: 1000 mg via ORAL
  Filled 2023-12-23: qty 2

## 2023-12-23 MED ORDER — INDOCYANINE GREEN 25 MG IJ SOLR
1.2500 mg | INTRAMUSCULAR | Status: AC
  Administered 2023-12-23: 1.25 mg via INTRAVENOUS
  Filled 2023-12-23: qty 10

## 2023-12-23 MED ORDER — MIDAZOLAM HCL 2 MG/2ML IJ SOLN
INTRAMUSCULAR | Status: AC
  Filled 2023-12-23: qty 2

## 2023-12-23 MED ORDER — FENTANYL CITRATE (PF) 50 MCG/ML IJ SOSY
PREFILLED_SYRINGE | INTRAMUSCULAR | Status: AC
  Filled 2023-12-23: qty 1

## 2023-12-23 MED ORDER — ONDANSETRON HCL 4 MG/2ML IJ SOLN
INTRAMUSCULAR | Status: AC
  Filled 2023-12-23: qty 2

## 2023-12-23 MED ORDER — ONDANSETRON HCL 4 MG/2ML IJ SOLN
4.0000 mg | Freq: Once | INTRAMUSCULAR | Status: AC
  Administered 2023-12-23: 4 mg via INTRAVENOUS
  Filled 2023-12-23: qty 2

## 2023-12-23 MED ORDER — SUGAMMADEX SODIUM 200 MG/2ML IV SOLN
INTRAVENOUS | Status: DC | PRN
  Administered 2023-12-23: 200 mg via INTRAVENOUS

## 2023-12-23 MED ORDER — SUCCINYLCHOLINE CHLORIDE 200 MG/10ML IV SOSY
PREFILLED_SYRINGE | INTRAVENOUS | Status: AC
  Filled 2023-12-23: qty 10

## 2023-12-23 MED ORDER — PROPOFOL 10 MG/ML IV BOLUS
INTRAVENOUS | Status: DC | PRN
  Administered 2023-12-23: 200 mg via INTRAVENOUS

## 2023-12-23 MED ORDER — KETOROLAC TROMETHAMINE 15 MG/ML IJ SOLN
15.0000 mg | Freq: Once | INTRAMUSCULAR | Status: AC
  Administered 2023-12-23: 15 mg via INTRAVENOUS
  Filled 2023-12-23: qty 1

## 2023-12-23 MED ORDER — DEXAMETHASONE SOD PHOSPHATE PF 10 MG/ML IJ SOLN
INTRAMUSCULAR | Status: DC | PRN
  Administered 2023-12-23: 10 mg via INTRAVENOUS

## 2023-12-23 MED ORDER — ROCURONIUM BROMIDE 10 MG/ML (PF) SYRINGE
PREFILLED_SYRINGE | INTRAVENOUS | Status: DC | PRN
  Administered 2023-12-23: 40 mg via INTRAVENOUS
  Administered 2023-12-23: 10 mg via INTRAVENOUS

## 2023-12-23 MED ORDER — PHENYLEPHRINE 80 MCG/ML (10ML) SYRINGE FOR IV PUSH (FOR BLOOD PRESSURE SUPPORT)
PREFILLED_SYRINGE | INTRAVENOUS | Status: DC | PRN
  Administered 2023-12-23 (×3): 160 ug via INTRAVENOUS

## 2023-12-23 MED ORDER — DROPERIDOL 2.5 MG/ML IJ SOLN
0.6250 mg | Freq: Once | INTRAMUSCULAR | Status: AC | PRN
  Administered 2023-12-23: 0.625 mg via INTRAVENOUS

## 2023-12-23 MED ORDER — LACTATED RINGERS IV SOLN: INTRAVENOUS | Status: DC | PRN

## 2023-12-23 MED ORDER — ACETAMINOPHEN 500 MG PO TABS: 1000.0000 mg | ORAL_TABLET | Freq: Four times a day (QID) | ORAL | Status: AC | PRN

## 2023-12-23 MED ORDER — ROCURONIUM BROMIDE 10 MG/ML (PF) SYRINGE
PREFILLED_SYRINGE | INTRAVENOUS | Status: AC
  Filled 2023-12-23: qty 10

## 2023-12-23 MED ORDER — BUPIVACAINE-EPINEPHRINE (PF) 0.25% -1:200000 IJ SOLN
INTRAMUSCULAR | Status: AC
  Filled 2023-12-23: qty 30

## 2023-12-23 MED ORDER — DROPERIDOL 2.5 MG/ML IJ SOLN
INTRAMUSCULAR | Status: AC
  Filled 2023-12-23: qty 2

## 2023-12-23 SURGICAL SUPPLY — 29 items
BAG COUNTER SPONGE SURGICOUNT (BAG) IMPLANT
CHLORAPREP W/TINT 26 (MISCELLANEOUS) ×1 IMPLANT
CLIP APPLIE 5 13 M/L LIGAMAX5 (MISCELLANEOUS) ×1 IMPLANT
COVER MAYO STAND STRL (DRAPES) IMPLANT
DERMABOND ADVANCED .7 DNX12 (GAUZE/BANDAGES/DRESSINGS) ×1 IMPLANT
DRAPE C-ARM 42X120 X-RAY (DRAPES) IMPLANT
ELECT REM PT RETURN 15FT ADLT (MISCELLANEOUS) ×1 IMPLANT
ENDOLOOP SUT PDS II 0 18 (SUTURE) IMPLANT
GLOVE BIO SURGEON STRL SZ 6.5 (GLOVE) ×1 IMPLANT
GLOVE INDICATOR 6.5 STRL GRN (GLOVE) ×2 IMPLANT
GOWN STRL REUS W/ TWL XL LVL3 (GOWN DISPOSABLE) ×3 IMPLANT
IRRIGATION SUCT STRKRFLW 2 WTP (MISCELLANEOUS) ×1 IMPLANT
IV CATH 14GX2 1/4 (CATHETERS) IMPLANT
KIT BASIN OR (CUSTOM PROCEDURE TRAY) ×1 IMPLANT
KIT IMAGING PINPOINTPAQ (MISCELLANEOUS) IMPLANT
KIT TURNOVER KIT A (KITS) ×1 IMPLANT
PENCIL SMOKE EVACUATOR (MISCELLANEOUS) IMPLANT
SCISSORS LAP 5X35 DISP (ENDOMECHANICALS) ×1 IMPLANT
SET CHOLANGIOGRAPH MIX (MISCELLANEOUS) IMPLANT
SET TUBE SMOKE EVAC HIGH FLOW (TUBING) ×1 IMPLANT
SLEEVE ADV FIXATION 5X100MM (TROCAR) ×2 IMPLANT
SUT VIC AB 2-0 SH 27X BRD (SUTURE) ×1 IMPLANT
SUT VIC AB 4-0 PS2 18 (SUTURE) ×1 IMPLANT
SUT VICRYL 0 UR6 27IN ABS (SUTURE) ×1 IMPLANT
SYSTEM BAG RETRIEVAL 10MM (BASKET) ×1 IMPLANT
TOWEL OR DSP ST BLU DLX 10/PK (DISPOSABLE) ×1 IMPLANT
TRAY LAPAROSCOPIC (CUSTOM PROCEDURE TRAY) ×1 IMPLANT
TROCAR ADV FIXATION 5X100MM (TROCAR) ×1 IMPLANT
TROCAR BALLN 12MMX100 BLUNT (TROCAR) IMPLANT

## 2023-12-23 NOTE — Anesthesia Postprocedure Evaluation (Signed)
 Anesthesia Post Note  Patient: Elizabeth Brennan  Procedure(s) Performed: LAPAROSCOPIC CHOLECYSTECTOMY (Abdomen)     Patient location during evaluation: PACU Anesthesia Type: General Level of consciousness: awake and alert Pain management: pain level controlled Vital Signs Assessment: post-procedure vital signs reviewed and stable Respiratory status: spontaneous breathing, nonlabored ventilation, respiratory function stable and patient connected to nasal cannula oxygen Cardiovascular status: blood pressure returned to baseline and stable Postop Assessment: no apparent nausea or vomiting Anesthetic complications: no   No notable events documented.  Last Vitals:  Vitals:   12/23/23 1745 12/23/23 1806  BP: 110/75 119/77  Pulse:  96  Resp: (!) 22 20  Temp:    SpO2:  100%    Last Pain:  Vitals:   12/23/23 1745  TempSrc:   PainSc: 0-No pain                 Epifanio Lamar BRAVO

## 2023-12-23 NOTE — H&P (Signed)
 Elizabeth Brennan Feb 05, 1980  [id number].    Requesting MD: Yolande, MD Chief Complaint/Reason for Consult: cholecystitis   HPI:  Elizabeth Brennan is a 43 y/o F who presents with worsening upper abdominal pain. Pain started 2-3 weeks ago, waxing and waning, but has become more constant and severe in the last 5 days. Pain described as sharp, worse in RUQ with radiation to her back. Exacerbated by PO intake. Associated with poor PO intake, nausea, and vomiting. Denies fever, chills, diarrhea, constipation, melena, or hematochezia. Past abdominal surgeries include appendectomy over 20 years ago, denies complications during/after this operation. Denies use of blood thinners. NKDA, does not consume pork. States she is currently employed in a position with occasional heavy lifting, has been there for 8 years. Her husband is at the bedside with her.   ROS: As above Review of Systems  All other systems reviewed and are negative.   History reviewed. No pertinent family history.  Past Medical History:  Diagnosis Date   Back pain    Medical history non-contributory     Past Surgical History:  Procedure Laterality Date   ANTERIOR CERVICAL DECOMP/DISCECTOMY FUSION N/A 11/08/2021   Procedure: ACDF - C4-C5 - C5-C6;  Surgeon: Louis Shove, MD;  Location: [facility];  Service: Neurosurgery;  Laterality: N/A;  3C   APPENDECTOMY     CESAREAN SECTION N/A 02/02/2018   Procedure: CESAREAN SECTION;  Surgeon: Barbra Lang PARAS, DO;  Location: [facility] SU[facility];  Service: Obstetrics;  Laterality: N/A;    Social History:  reports that she has never smoked. She has never used smokeless tobacco. She reports that she does not drink alcohol and does not use drugs.  Allergies:  Allergies  Allergen Reactions   Porcine (Pork) Protein-Containing Drug Products Other (See Comments)    Cultural value     (Not in a hospital admission)    Physical Exam: Blood pressure 98/75, pulse 84, temperature 99.1 F (37.3 C),  temperature source Oral, resp. rate 18, weight 75 kg, last menstrual period 12/09/2023, SpO2 99%. General: Pleasant female laying on hospital bed, appears stated age, NAD. HEENT: head -normocephalic, atraumatic; Eyes: PERRLA, no conjunctival injection; anicteric sclerae.  Neck- Trachea is midline CV- RRR, normal S1/S2, no M/R/G, no lower extremity edema  Pulm- breathing is non-labored ORA Abd- soft, TTP in RUQ, no masses, hernias, or organomegaly. GU- deferred  MSK- UE/LE symmetrical, no cyanosis, clubbing, or edema. Neuro- CN II-XII grossly in tact, no paresthesias. Psych- Alert and Oriented x3 with appropriate affect Skin: warm and dry, no rashes or lesions   Results for orders placed or performed during the hospital encounter of 12/23/23 (from the past 48 hours)  CBC with Differential     Status: None   Collection Time: 12/23/23 12:19 PM  Result Value Ref Range   WBC 7.0 4.0 - 10.5 K/uL   RBC 4.86 3.87 - 5.11 MIL/uL   Hemoglobin 13.6 12.0 - 15.0 g/dL   HCT 58.5 63.9 - 53.9 %   MCV 85.2 80.0 - 100.0 fL   MCH 28.0 26.0 - 34.0 pg   MCHC 32.9 30.0 - 36.0 g/dL   RDW 87.3 88.4 - 84.4 %   Platelets 222 150 - 400 K/uL   nRBC 0.0 0.0 - 0.2 %   Neutrophils Relative % 64 %   Neutro Abs 4.5 1.7 - 7.7 K/uL   Lymphocytes Relative 27 %   Lymphs Abs 1.9 0.7 - 4.0 K/uL   Monocytes Relative 8 %   Monocytes  Absolute 0.5 0.1 - 1.0 K/uL   Eosinophils Relative 1 %   Eosinophils Absolute 0.1 0.0 - 0.5 K/uL   Basophils Relative 0 %   Basophils Absolute 0.0 0.0 - 0.1 K/uL   Immature Granulocytes 0 %   Abs Immature Granulocytes 0.01 0.00 - 0.07 K/uL    Comment: Performed at [facility], [address] [place], [place] [address]  Comprehensive metabolic panel     Status: Abnormal   Collection Time: 12/23/23 12:19 PM  Result Value Ref Range   Sodium 136 135 - 145 mmol/L   Potassium 3.8 3.5 - 5.1 mmol/L   Chloride 102 98 - 111 mmol/L   CO2 24 22 - 32 mmol/L   Glucose, Bld 100 (H)  70 - 99 mg/dL    Comment: Glucose reference range applies only to samples taken after fasting for at least 8 hours.   BUN 11 6 - 20 mg/dL   Creatinine, Ser 9.36 0.44 - 1.00 mg/dL   Calcium 9.2 8.9 - 89.6 mg/dL   Total Protein 7.9 6.5 - 8.1 g/dL   Albumin 4.0 3.5 - 5.0 g/dL   AST 14 (L) 15 - 41 U/L   ALT 7 0 - 44 U/L   Alkaline Phosphatase 67 38 - 126 U/L   Total Bilirubin 0.5 0.0 - 1.2 mg/dL   GFR, Estimated >39 >39 mL/min    Comment: (NOTE) Calculated using the CKD-EPI Creatinine Equation (2021)    Anion gap 11 5 - 15    Comment: Performed at [facility], [address] [place], [place] [address]  Lipase, blood     Status: None   Collection Time: 12/23/23 12:19 PM  Result Value Ref Range   Lipase 18 11 - 51 U/L    Comment: Performed at [facility], [address] [place], [place] [address]   US  Abdomen Limited RUQ (LIVER/GB) Addendum Date: 12/23/2023 ADDENDUM REPORT: 12/23/2023 12:28 ADDENDUM: Results were discussed with Dr. Lamar Gander at 12:25 p.m. Eastern on December 23, 2023. Electronically Signed   By: Suzen Dials M.D.   On: 12/23/2023 12:28   Result Date: 12/23/2023 CLINICAL DATA:  Right upper quadrant pain x5 days. EXAM: ULTRASOUND ABDOMEN LIMITED RIGHT UPPER QUADRANT COMPARISON:  None Available. FINDINGS: Gallbladder: Numerous small shadowing, echogenic gallstones are seen within the dependent portion of a moderate to markedly distended gallbladder. There is gallbladder wall thickening (4.1 mm). A positive sonographic Beverley sign is noted by sonographer. Common bile duct: Diameter: 5.0 mm Liver: No focal lesion identified. Within normal limits in parenchymal echogenicity. Portal vein is patent on color Doppler imaging with normal direction of blood flow towards the liver. Other: None. IMPRESSION: Cholelithiasis with additional findings consistent with acute cholecystitis. Electronically Signed: By: Suzen Dials M.D. On: 12/23/2023 12:19       Assessment/Plan Acute calculous cholecystitis - afebrile, VSS, WBC 7.0, LFTs WNL, lipase WNL - RUQ U/S with cholelithiasis, wall thickening (4.1 mm), and positive sonographic murphy's sign  The operative and non-operative management of cholecystitis was discussed with the patient. Risks of surgery including bleeding, infection, damage to surrounding structures, conversion to open, drain placement, need for additional procedures, prolonged hospital stay, as well as the risks of general anesthesia were discussed with the patient and she would like to proceed with surgery. Questions were welcomed and answered.   FEN - NPO, IVF VTE - SCD's ID - Rocephin  Admit - home from PACU vs admit to observation pending  outcome of surgery  I reviewed nursing notes, ED provider notes, last 24 h vitals and pain scores, last 48 h intake and output, last 24 h labs and trends, and last 24 h imaging results.  Almarie GORMAN Pringle, PA-C [facility] 12/23/2023, 1:11 PM Please see Amion for pager number during day hours 7:00am-4:30pm or 7:00am -11:30am on weekends

## 2023-12-23 NOTE — Discharge Instructions (Signed)
 LAPAROSCOPIC SURGERY: POST OP INSTRUCTIONS Always review your discharge instruction sheet given to you by the facility where your surgery was performed. IF YOU HAVE DISABILITY OR FAMILY LEAVE FORMS, YOU MUST BRING THEM TO THE OFFICE FOR PROCESSING.   DO NOT GIVE THEM TO YOUR DOCTOR.  PAIN CONTROL  First take acetaminophen (Tylenol) AND/or ibuprofen (Advil) to control your pain after surgery.  Follow directions on package.  Taking acetaminophen (Tylenol) and/or ibuprofen (Advil) regularly after surgery will help to control your pain and lower the amount of prescription pain medication you may need.  You should not take more than 3,000 mg (3 grams) of acetaminophen (Tylenol) in 24 hours.  You should not take ibuprofen (Advil), aleve, motrin, naprosyn or other NSAIDS if you have a history of stomach ulcers or chronic kidney disease.  A prescription for pain medication may be given to you upon discharge.  Take your pain medication as prescribed, if you still have uncontrolled pain after taking acetaminophen (Tylenol) or ibuprofen (Advil). Use ice packs to help control pain. If you need a refill on your pain medication, please contact your pharmacy.  They will contact our office to request authorization. Prescriptions will not be filled after 5pm or on week-ends.  HOME MEDICATIONS Take your usually prescribed medications unless otherwise directed.  DIET You should follow a light diet the first few days after arrival home.  Be sure to include lots of fluids daily. Avoid fatty, fried foods.   CONSTIPATION It is common to experience some constipation after surgery and if you are taking pain medication.  Increasing fluid intake and taking a stool softener (such as Colace) will usually help or prevent this problem from occurring.  A mild laxative (Milk of Magnesia or Miralax) should be taken according to package instructions if there are no bowel movements after 48 hours.  WOUND/INCISION CARE Most  patients will experience some swelling and bruising in the area of the incisions.  Ice packs will help.  Swelling and bruising can take several days to resolve.  Unless discharge instructions indicate otherwise, follow guidelines below  STERI-STRIPS - you may remove your outer bandages 48 hours after surgery, and you may shower at that time.  You have steri-strips (small skin tapes) in place directly over the incision.  These strips should be left on the skin for 7-10 days.   DERMABOND/SKIN GLUE - you may shower in 24 hours.  The glue will flake off over the next 2-3 weeks. Any sutures or staples will be removed at the office during your follow-up visit.  ACTIVITIES You may resume regular (light) daily activities beginning the next day--such as daily self-care, walking, climbing stairs--gradually increasing activities as tolerated.  You may have sexual intercourse when it is comfortable.  Refrain from any heavy lifting or straining until approved by your doctor. You may drive when you are no longer taking prescription pain medication, you can comfortably wear a seatbelt, and you can safely maneuver your car and apply brakes.  FOLLOW-UP You should see your doctor in the office for a follow-up appointment approximately 2-3 weeks after your surgery.  You should have been given your post-op/follow-up appointment when your surgery was scheduled.  If you did not receive a post-op/follow-up appointment, make sure that you call for this appointment within a day or two after you arrive home to insure a convenient appointment time.  OTHER INSTRUCTIONS   WHEN TO CALL YOUR DOCTOR: Fever over 101.0 Inability to urinate Continued bleeding from incision. Increased pain,  redness, or drainage from the incision. Increasing abdominal pain  The clinic staff is available to answer your questions during regular business hours.  Please don't hesitate to call and ask to speak to one of the nurses for clinical  concerns.  If you have a medical emergency, go to the nearest emergency room or call 911.  A surgeon from [facility] is always on call at the hospital. [address], [place], [place]  [address] ? [address] [address], [place], [place]   [address] [phone] ? [phone] ? FAX [phone] Web site: [email or web address]

## 2023-12-23 NOTE — Op Note (Signed)
 12/23/2023  4:34 PM  PATIENT:  Elizabeth Brennan  43 y.o. female  Patient Care Team: Desiree Quale, NP as PCP - General (Nurse Practitioner)  PRE-OPERATIVE DIAGNOSIS:  ACUTE CHOLECYSITIS  POST-OPERATIVE DIAGNOSIS:  ACUTE CHOLECYSITIS  PROCEDURE:  LAPAROSCOPIC CHOLECYSTECTOMY   Surgeon(s): Debby Hila, MD  ASSISTANT: Vertell Pringle, PA   ANESTHESIA:   local and general  EBL:  Total I/O In: 900 [I.V.:800; IV Piggyback:100] Out: -   DRAINS: none   SPECIMEN:  Source of Specimen:  gallbladder  DISPOSITION OF SPECIMEN:  PATHOLOGY  COUNTS:  YES  PLAN OF CARE: Discharge to home after PACU  PATIENT DISPOSITION:  PACU - hemodynamically stable.  INDICATION: 43 y.o. F with several week of RUQ pain that began to worsen over the last 5 days  The anatomy & physiology of hepatobiliary & pancreatic function was discussed.  The pathophysiology of gallbladder dysfunction was discussed.  Natural history risks without surgery was discussed.   I feel the risks of no intervention will lead to serious problems that outweigh the operative risks; therefore, I recommended cholecystectomy to remove the pathology.  I explained laparoscopic techniques with possible need for an open approach.  Probable cholangiogram to evaluate the bilary tract was explained as well.    Risks such as bleeding, infection, abscess, leak, injury to other organs, need for further treatment, heart attack, death, and other risks were discussed.  I noted a good likelihood this will help address the problem.  Possibility that this will not correct all abdominal symptoms was explained.  Goals of post-operative recovery were discussed as well.    OR FINDINGS: large, edematous gallbladder  DESCRIPTION:   The patient was identified & brought into the operating room. The patient was positioned supine with arms tucked. SCDs were active during the entire case. The patient underwent general anesthesia without any difficulty.  The  abdomen was prepped and draped in a sterile fashion. A Surgical Timeout was performed and confirmed our plan.  We positioned the patient in reverse [place] & right side up.  I placed a Elizabeth laparoscopic port through the umbilicus using open entry technique.  Entry was clean. There were no adhesions to the anterior abdominal wall supraumbilically.  We induced carbon dioxide insufflation. Camera inspection revealed no injury.    I proceeded to continue with laparoscopic technique. I placed a 5 mm port in mid subcostal region, another 5mm port in the right flank near the anterior axillary line, and a 5mm port in the left subxiphoid region obliquely within the falciform ligament.  I turned attention to the right upper quadrant. The gallbladder was distended and aspirated with the needle aspirator.  The gallbladder fundus was elevated cephalad. I used cautery and blunt dissection to free the omentum and peritoneal coverings between the gallbladder and the liver on the posteriolateral and anteriomedial walls.   I used careful blunt and cautery dissection with a hook cautery dissector to help get a good critical view of the cystic artery and cystic duct. I did further dissection to free a few centimeters of the  gallbladder off the liver bed to get a good critical view of the infundibulum and cystic duct. I mobilized the cystic artery.  I skeletonized the cystic duct.  After getting a good 360 view, I decided not to perform a cholangiogram. I could see the cystic duct and bile duct locations with the firefly.  I placed a clip on the infundibulum.  I placed clips on the cystic duct x3.  I  completed cystic duct transection.   I placed clips on the cystic artery x3 with 2 proximally.  I ligated the cystic artery using scissors. I freed the gallbladder from its remaining attachments to the liver. I ensured hemostasis on the gallbladder fossa of the liver and elsewhere. I inspected the rest of the abdomen &  detected no injury nor bleeding elsewhere.  I irrigated the RUQ with normal saline.  I removed the gallbladder through the umbilical port site.  I closed the umbilical fascia using 0 Vicryl stitches.   I closed the skin using 4-0 vicryl stitch.  Sterile dressings were applied. The patient was extubated & arrived in the PACU in stable condition.  I had discussed postoperative care with the patient in the holding area.  I will discuss  operative findings and postoperative goals / instructions with the patient's family.  Instructions are written in the chart as well.   Bernarda JAYSON Ned, MD  Colorectal and General Surgery [facility]

## 2023-12-23 NOTE — ED Triage Notes (Signed)
 RUQ pain , was seen at [facility] Ed last night , left after triage , labs done .  Reports nausea , no emesis , no diarrhea

## 2023-12-23 NOTE — Progress Notes (Signed)
 Interpreter online via ipad used for interpretation

## 2023-12-23 NOTE — ED Provider Notes (Signed)
 [facility] Provider Note   CSN: [id number] Arrival date & time: 12/23/23  1030     Patient presents with: Abdominal Pain   Elizabeth Brennan is a 43 y.o. female.   History obtained via arabic interpreter  43 yo F with hx of appendectomy who presents with RUQ abdominal pain that started a few weeks ago. Sometimes is generalized but often times localizes to the RUQ. Started a few weeks ago but over the past 5 days has been severe. Nausea but no vomiting. Sharp and stabbing. Post prandial. Radiates to back. No fevers. 8/10 now.        Prior to Admission medications   Medication Sig Start Date End Date Taking? Authorizing Provider  cyclobenzaprine  (FLEXERIL ) 10 MG tablet Take 1 tablet (10 mg total) by mouth 3 (three) times daily as needed for muscle spasms. Patient not taking: Reported on 07/15/2022 11/09/21   Cheryle Debby LABOR, MD  HYDROcodone -acetaminophen  (NORCO/VICODIN) 5-325 MG tablet Take 1 tablet by mouth every 4 (four) hours as needed (pain). Patient not taking: Reported on 07/15/2022 11/09/21   Cheryle Debby LABOR, MD  ibuprofen  (ADVIL ) 200 MG tablet Take 200 mg by mouth every 6 (six) hours as needed for moderate pain. Pt states she takes up to 1,000mg  over the course of the day.    [provider]  methocarbamol  (ROBAXIN ) 500 MG tablet Take 1 tablet (500 mg total) by mouth 2 (two) times daily. 12/19/23   Leatrice Vernell HERO, NP  omeprazole  (PRILOSEC) 40 MG capsule Take 1 capsule (40 mg total) by mouth daily. 12/19/23   Leatrice Vernell HERO, NP    Allergies: Porcine (pork) protein-containing drug products    Review of Systems  Updated Vital Signs BP 98/75   Pulse 84   Temp 99.1 F (37.3 C) (Oral)   Resp 18   Wt 75 kg   LMP 12/09/2023 (Exact Date)   SpO2 99%   BMI 25.14 kg/m   Physical Exam Abdominal:     General: There is no distension.     Palpations: There is no mass.     Tenderness: There is abdominal tenderness.  There is no guarding.     Comments: + murphy's sign     (all labs ordered are listed, but only abnormal results are displayed) Labs Reviewed  CBC WITH DIFFERENTIAL/PLATELET  COMPREHENSIVE METABOLIC PANEL WITH GFR  LIPASE, BLOOD    EKG: None  Radiology: US  Abdomen Limited RUQ (LIVER/GB) Addendum Date: 12/23/2023 ADDENDUM REPORT: 12/23/2023 12:28 ADDENDUM: Results were discussed with Dr. Lamar Gander at 12:25 p.m. Eastern on December 23, 2023. Electronically Signed   By: Suzen Dials M.D.   On: 12/23/2023 12:28   Result Date: 12/23/2023 CLINICAL DATA:  Right upper quadrant pain x5 days. EXAM: ULTRASOUND ABDOMEN LIMITED RIGHT UPPER QUADRANT COMPARISON:  None Available. FINDINGS: Gallbladder: Numerous small shadowing, echogenic gallstones are seen within the dependent portion of a moderate to markedly distended gallbladder. There is gallbladder wall thickening (4.1 mm). A positive sonographic Beverley sign is noted by sonographer. Common bile duct: Diameter: 5.0 mm Liver: No focal lesion identified. Within normal limits in parenchymal echogenicity. Portal vein is patent on color Doppler imaging with normal direction of blood flow towards the liver. Other: None. IMPRESSION: Cholelithiasis with additional findings consistent with acute cholecystitis. Electronically Signed: By: Suzen Dials M.D. On: 12/23/2023 12:19     Procedures   Medications Ordered in the ED  cefTRIAXone  (ROCEPHIN ) 2 g in sodium chloride  0.9 %  100 mL IVPB (2 g Intravenous New Bag/Given 12/23/23 1233)  ketorolac  (TORADOL ) 15 MG/ML injection 15 mg (15 mg Intravenous Given 12/23/23 1227)  ondansetron  (ZOFRAN ) injection 4 mg (4 mg Intravenous Given 12/23/23 1226)    Clinical Course as of 12/23/23 1258  Wed Dec 23, 2023  1241 Vertell Pringle PA from General Surgery consulted.  Dr. Bernarda Ned to see for cholecystectomy at [facility]. [RP]    Clinical Course User Index [RP] Yolande Lamar BROCKS, MD                                  Medical Decision Making Amount and/or Complexity of Data Reviewed Labs: ordered. Radiology: ordered.  Risk Prescription drug management.   43 year old female history of appendectomy presents emergency department with right upper quadrant pain and nausea  Initial Ddx:  Cholecystitis, biliary colic, cholangitis, choledocholithiasis, gallstone pancreatitis  MDM/Course:  Patient presents to the emergency department with symptoms are concerning for cholecystitis.  Came to the emergency department yesterday and had blood work that included white blood cell count and LFTs that were normal.  Says that her symptoms worsened over the past 5 days.  On arrival she is afebrile.  Overall well-appearing.  Does have right upper quadrant tenderness to palpation and a positive Murphy sign.  Ultrasound consistent with cholecystitis.  Has white blood cell count of 7 with otherwise unremarkable CBC.  CMP and lipase pending at this point in time but with her normal LFTs yesterday did consult general surgery.  Patient reports that she has been n.p.o. since last night.  They are recommending transfer to [facility] for cholecystectomy.  Upon re-evaluation patient's pain and nausea was under control with Toradol  and Zofran .  Also given a dose of ceftriaxone .  Patient's husband is very familiar with [name] and is requesting to transfer her himself by POV.  Instructed to go straight there without any stops and not to eat or drink anything prior to this.  This patient presents to the ED for concern of complaints listed in HPI, this involves an extensive number of treatment options, and is a complaint that carries with it a high risk of complications and morbidity. Disposition including potential need for admission considered.   Dispo: [name] OR  Additional history obtained from spouse Records reviewed Outpatient Clinic Notes The following labs were independently interpreted: CBC  and show no acute abnormality I independently reviewed the following imaging with scope of interpretation limited to determining acute life threatening conditions related to emergency care: RUQ US  and agree with the radiologist interpretation with the following exceptions: none I have reviewed the patients home medications and made adjustments as needed Consults: General Surgery Social Determinants of health:  Arabic speaking only  Portions of this note were generated with Scientist, clinical (histocompatibility and immunogenetics). Dictation errors may occur despite best attempts at proofreading.     Final diagnoses:  Cholecystitis    ED Discharge Orders     None          Yolande Lamar BROCKS, MD 12/23/23 1258

## 2023-12-23 NOTE — Transfer of Care (Signed)
 Immediate Anesthesia Transfer of Care Note  Patient: [name]  Procedure(s) Performed: LAPAROSCOPIC CHOLECYSTECTOMY (Abdomen)  Patient Location: PACU  Anesthesia Type:General  Level of Consciousness: awake, alert , and oriented  Airway & Oxygen Therapy: Patient Spontanous Breathing and Patient connected to face mask oxygen  Post-op Assessment: Report given to RN  Post vital signs: Reviewed and stable  Last Vitals:  Vitals Value Taken Time  BP 121/97 12/23/23 16:36  Temp    Pulse    Resp 20 12/23/23 16:40  SpO2    Vitals shown include unfiled device data.  Last Pain:  Vitals:   12/23/23 1508  TempSrc: Oral  PainSc:       Patients Stated Pain Goal: 4 (12/23/23 1434)  Complications: No notable events documented.

## 2023-12-23 NOTE — Anesthesia Procedure Notes (Signed)
 Procedure Name: Intubation Date/Time: 12/23/2023 3:26 PM  Performed by: Carleton Garnette SAUNDERS, CRNAPre-anesthesia Checklist: Patient identified, Emergency Drugs available, Suction available, Patient being monitored and Timeout performed Patient Re-evaluated:Patient Re-evaluated prior to induction Oxygen Delivery Method: Circle system utilized Preoxygenation: Pre-oxygenation with 100% oxygen Induction Type: IV induction Ventilation: Mask ventilation without difficulty Laryngoscope Size: Mac, 3 and Glidescope Grade View: Grade I Tube type: Oral Number of attempts: 1 Airway Equipment and Method: Stylet Placement Confirmation: ETT inserted through vocal cords under direct vision, positive ETCO2 and breath sounds checked- equal and bilateral Secured at: 22 cm Tube secured with: Tape Dental Injury: Teeth and Oropharynx as per pre-operative assessment

## 2023-12-23 NOTE — Anesthesia Preprocedure Evaluation (Signed)
 Anesthesia Evaluation  Patient identified by MRN, date of birth, ID band Patient awake    Reviewed: Allergy & Precautions, NPO status , Patient's Chart, lab work & pertinent test results  Airway Mallampati: II  TM Distance: >3 FB Neck ROM: Full    Dental  (+) Dental Advisory Given   Pulmonary neg pulmonary ROS   breath sounds clear to auscultation       Cardiovascular negative cardio ROS  Rhythm:Regular Rate:Normal     Neuro/Psych  Neuromuscular disease    GI/Hepatic negative GI ROS, Neg liver ROS,,,  Endo/Other  negative endocrine ROS    Renal/GU negative Renal ROS     Musculoskeletal   Abdominal   Peds  Hematology negative hematology ROS (+)   Anesthesia Other Findings   Reproductive/Obstetrics                              Anesthesia Physical Anesthesia Plan  ASA: 2  Anesthesia Plan: General   Post-op Pain Management: Ofirmev  IV (intra-op)* and Toradol  IV (intra-op)*   Induction: Intravenous  PONV Risk Score and Plan: 3 and Dexamethasone , Ondansetron , Midazolam  and Treatment may vary due to age or medical condition  Airway Management Planned: Oral ETT  Additional Equipment:   Intra-op Plan:   Post-operative Plan: Extubation in OR  Informed Consent: I have reviewed the patients History and Physical, chart, labs and discussed the procedure including the risks, benefits and alternatives for the proposed anesthesia with the patient or authorized representative who has indicated his/her understanding and acceptance.     Dental advisory given  Plan Discussed with: CRNA  Anesthesia Plan Comments:         Anesthesia Quick Evaluation

## 2023-12-23 NOTE — ED Notes (Addendum)
 Pt transferred to [facility] short stay pov. Pt alert and oriented X 4 at the time of transfer RR even and unlabored. No acute distress noted. Pt verbalized understanding of transfer  instructions as discussed. Pt in wheelchair to lobby at time of discharge.  IV secured and left in place.

## 2023-12-24 ENCOUNTER — Encounter (HOSPITAL_COMMUNITY): Payer: Self-pay | Admitting: General Surgery

## 2023-12-25 LAB — SURGICAL PATHOLOGY

## 2023-12-27 ENCOUNTER — Emergency Department (HOSPITAL_COMMUNITY): Payer: Self-pay

## 2023-12-27 ENCOUNTER — Inpatient Hospital Stay (HOSPITAL_COMMUNITY): Admission: EM | Admit: 2023-12-27 | Discharge: 2023-12-30 | DRG: 394 | Disposition: A | Payer: Self-pay

## 2023-12-27 ENCOUNTER — Other Ambulatory Visit: Payer: Self-pay

## 2023-12-27 ENCOUNTER — Encounter (HOSPITAL_COMMUNITY): Payer: Self-pay

## 2023-12-27 DIAGNOSIS — R188 Other ascites: Secondary | ICD-10-CM | POA: Diagnosis present

## 2023-12-27 DIAGNOSIS — E876 Hypokalemia: Secondary | ICD-10-CM | POA: Diagnosis present

## 2023-12-27 DIAGNOSIS — R1013 Epigastric pain: Secondary | ICD-10-CM

## 2023-12-27 DIAGNOSIS — K838 Other specified diseases of biliary tract: Secondary | ICD-10-CM | POA: Diagnosis present

## 2023-12-27 DIAGNOSIS — Z981 Arthrodesis status: Secondary | ICD-10-CM

## 2023-12-27 DIAGNOSIS — Y838 Other surgical procedures as the cause of abnormal reaction of the patient, or of later complication, without mention of misadventure at the time of the procedure: Secondary | ICD-10-CM | POA: Diagnosis present

## 2023-12-27 DIAGNOSIS — Z9049 Acquired absence of other specified parts of digestive tract: Secondary | ICD-10-CM

## 2023-12-27 DIAGNOSIS — Z91014 Allergy to mammalian meats: Secondary | ICD-10-CM

## 2023-12-27 DIAGNOSIS — K9189 Other postprocedural complications and disorders of digestive system: Principal | ICD-10-CM | POA: Diagnosis present

## 2023-12-27 DIAGNOSIS — G8918 Other acute postprocedural pain: Principal | ICD-10-CM

## 2023-12-27 DIAGNOSIS — Z603 Acculturation difficulty: Secondary | ICD-10-CM | POA: Diagnosis present

## 2023-12-27 LAB — COMPREHENSIVE METABOLIC PANEL WITH GFR
ALT: 43 U/L (ref 0–44)
AST: 39 U/L (ref 15–41)
Albumin: 3.5 g/dL (ref 3.5–5.0)
Alkaline Phosphatase: 349 U/L — ABNORMAL HIGH (ref 38–126)
Anion gap: 12 (ref 5–15)
BUN: 13 mg/dL (ref 6–20)
CO2: 25 mmol/L (ref 22–32)
Calcium: 9.7 mg/dL (ref 8.9–10.3)
Chloride: 99 mmol/L (ref 98–111)
Creatinine, Ser: 0.56 mg/dL (ref 0.44–1.00)
GFR, Estimated: >60 mL/min (ref >60)
Glucose, Bld: 120 mg/dL — ABNORMAL HIGH (ref 70–99)
Potassium: 3.3 mmol/L — ABNORMAL LOW (ref 3.5–5.1)
Sodium: 135 mmol/L (ref 135–145)
Total Bilirubin: 1.3 mg/dL — ABNORMAL HIGH (ref 0.0–1.2)
Total Protein: 8.3 g/dL — ABNORMAL HIGH (ref 6.5–8.1)

## 2023-12-27 LAB — CBC WITH DIFFERENTIAL/PLATELET
Abs Immature Granulocytes: 0.02 K/uL (ref 0.00–0.07)
Basophils Absolute: 0 K/uL (ref 0.0–0.1)
Basophils Relative: 0 %
Eosinophils Absolute: 0.2 K/uL (ref 0.0–0.5)
Eosinophils Relative: 2 %
HCT: 41.3 % (ref 36.0–46.0)
Hemoglobin: 13.8 g/dL (ref 12.0–15.0)
Immature Granulocytes: 0 %
Lymphocytes Relative: 18 %
Lymphs Abs: 1.3 K/uL (ref 0.7–4.0)
MCH: 28.4 pg (ref 26.0–34.0)
MCHC: 33.4 g/dL (ref 30.0–36.0)
MCV: 85 fL (ref 80.0–100.0)
Monocytes Absolute: 0.5 K/uL (ref 0.1–1.0)
Monocytes Relative: 7 %
Neutro Abs: 5.5 K/uL (ref 1.7–7.7)
Neutrophils Relative %: 73 %
Platelets: 313 K/uL (ref 150–400)
RBC: 4.86 MIL/uL (ref 3.87–5.11)
RDW: 12.7 % (ref 11.5–15.5)
WBC: 7.6 K/uL (ref 4.0–10.5)
nRBC: 0 % (ref 0.0–0.2)

## 2023-12-27 LAB — URINALYSIS, ROUTINE W REFLEX MICROSCOPIC
Glucose, UA: NEGATIVE mg/dL
Ketones, ur: 20 mg/dL — AB
Nitrite: NEGATIVE
Protein, ur: 100 mg/dL — AB
RBC / HPF: >50 RBC/hpf (ref 0–5)
Specific Gravity, Urine: 1.035 — ABNORMAL HIGH (ref 1.005–1.030)
pH: 5 (ref 5.0–8.0)

## 2023-12-27 LAB — LIPASE, BLOOD: Lipase: 13 U/L (ref 11–51)

## 2023-12-27 LAB — I-STAT CG4 LACTIC ACID, ED: Lactic Acid, Venous: 0.9 mmol/L (ref 0.5–1.9)

## 2023-12-27 MED ORDER — PROCHLORPERAZINE MALEATE 10 MG PO TABS: 10.0000 mg | ORAL_TABLET | Freq: Four times a day (QID) | ORAL | Status: DC | PRN

## 2023-12-27 MED ORDER — DIPHENHYDRAMINE HCL 50 MG/ML IJ SOLN: 12.5000 mg | Freq: Four times a day (QID) | INTRAMUSCULAR | Status: DC | PRN

## 2023-12-27 MED ORDER — PIPERACILLIN-TAZOBACTAM 3.375 G IVPB
3.3750 g | Freq: Three times a day (TID) | INTRAVENOUS | Status: DC
  Administered 2023-12-27 – 2023-12-30 (×8): 3.375 g via INTRAVENOUS
  Filled 2023-12-27 (×8): qty 50

## 2023-12-27 MED ORDER — SIMETHICONE 80 MG PO CHEW: 40.0000 mg | CHEWABLE_TABLET | Freq: Four times a day (QID) | ORAL | Status: DC | PRN

## 2023-12-27 MED ORDER — FONDAPARINUX SODIUM 2.5 MG/0.5ML ~~LOC~~ SOLN
2.5000 mg | SUBCUTANEOUS | Status: DC
  Administered 2023-12-27 – 2023-12-29 (×3): 2.5 mg via SUBCUTANEOUS
  Filled 2023-12-27 (×3): qty 0.5

## 2023-12-27 MED ORDER — METHOCARBAMOL 500 MG PO TABS
500.0000 mg | ORAL_TABLET | Freq: Four times a day (QID) | ORAL | Status: DC | PRN
  Administered 2023-12-28: 20:00:00 500 mg via ORAL
  Filled 2023-12-27: qty 1

## 2023-12-27 MED ORDER — KCL IN DEXTROSE-NACL 20-5-0.45 MEQ/L-%-% IV SOLN
INTRAVENOUS | Status: DC
  Filled 2023-12-27 (×2): qty 1000

## 2023-12-27 MED ORDER — IBUPROFEN 400 MG PO TABS: 400.0000 mg | ORAL_TABLET | Freq: Four times a day (QID) | ORAL | Status: DC | PRN

## 2023-12-27 MED ORDER — TECHNETIUM TC 99M MEBROFENIN IV KIT: 5.4000 | PACK | Freq: Once | INTRAVENOUS | Status: DC | PRN

## 2023-12-27 MED ORDER — FENTANYL CITRATE (PF) 50 MCG/ML IJ SOSY
50.0000 ug | PREFILLED_SYRINGE | Freq: Once | INTRAMUSCULAR | Status: AC
  Administered 2023-12-27: 50 ug via INTRAVENOUS
  Filled 2023-12-27: qty 1

## 2023-12-27 MED ORDER — ONDANSETRON 4 MG PO TBDP: 4.0000 mg | ORAL_TABLET | Freq: Four times a day (QID) | ORAL | Status: DC | PRN

## 2023-12-27 MED ORDER — PANTOPRAZOLE SODIUM 40 MG IV SOLR
40.0000 mg | Freq: Once | INTRAVENOUS | Status: AC
  Administered 2023-12-27: 40 mg via INTRAVENOUS
  Filled 2023-12-27: qty 10

## 2023-12-27 MED ORDER — IOHEXOL 300 MG/ML  SOLN
100.0000 mL | Freq: Once | INTRAMUSCULAR | Status: AC | PRN
  Administered 2023-12-27: 100 mL via INTRAVENOUS

## 2023-12-27 MED ORDER — PROCHLORPERAZINE EDISYLATE 10 MG/2ML IJ SOLN: 5.0000 mg | Freq: Four times a day (QID) | INTRAMUSCULAR | Status: DC | PRN

## 2023-12-27 MED ORDER — SENNA 8.6 MG PO TABS
1.0000 | ORAL_TABLET | Freq: Two times a day (BID) | ORAL | Status: DC
  Administered 2023-12-27 – 2023-12-29 (×3): 8.6 mg via ORAL
  Filled 2023-12-27 (×5): qty 1

## 2023-12-27 MED ORDER — MELATONIN 3 MG PO TABS
3.0000 mg | ORAL_TABLET | Freq: Every evening | ORAL | Status: DC | PRN
  Administered 2023-12-28: 22:00:00 3 mg via ORAL
  Filled 2023-12-27: qty 1

## 2023-12-27 MED ORDER — POLYETHYLENE GLYCOL 3350 17 G PO PACK: 17.0000 g | PACK | Freq: Every day | ORAL | Status: DC | PRN

## 2023-12-27 MED ORDER — ACETAMINOPHEN 500 MG PO TABS
1000.0000 mg | ORAL_TABLET | Freq: Three times a day (TID) | ORAL | Status: DC
  Administered 2023-12-27 – 2023-12-28 (×2): 1000 mg via ORAL
  Filled 2023-12-27 (×5): qty 2

## 2023-12-27 MED ORDER — OXYCODONE HCL 5 MG PO TABS: 5.0000 mg | ORAL_TABLET | ORAL | Status: DC | PRN

## 2023-12-27 MED ORDER — MORPHINE SULFATE (PF) 2 MG/ML IV SOLN
1.0000 mg | INTRAVENOUS | Status: DC | PRN
  Administered 2023-12-28 (×2): 2 mg via INTRAVENOUS
  Filled 2023-12-27 (×2): qty 1

## 2023-12-27 MED ORDER — DIPHENHYDRAMINE HCL 12.5 MG/5ML PO ELIX: 12.5000 mg | ORAL_SOLUTION | Freq: Four times a day (QID) | ORAL | Status: DC | PRN

## 2023-12-27 MED ORDER — ONDANSETRON HCL 4 MG/2ML IJ SOLN
4.0000 mg | Freq: Once | INTRAMUSCULAR | Status: AC
  Administered 2023-12-27: 4 mg via INTRAVENOUS
  Filled 2023-12-27: qty 2

## 2023-12-27 MED ORDER — SODIUM CHLORIDE 0.9 % IV BOLUS
1000.0000 mL | Freq: Once | INTRAVENOUS | Status: AC
  Administered 2023-12-27: 1000 mL via INTRAVENOUS

## 2023-12-27 MED ORDER — ONDANSETRON HCL 4 MG/2ML IJ SOLN: 4.0000 mg | Freq: Four times a day (QID) | INTRAMUSCULAR | Status: DC | PRN

## 2023-12-27 MED ORDER — PANTOPRAZOLE SODIUM 40 MG PO TBEC
40.0000 mg | DELAYED_RELEASE_TABLET | Freq: Every day | ORAL | Status: DC
  Administered 2023-12-27 – 2023-12-30 (×4): 40 mg via ORAL
  Filled 2023-12-27 (×4): qty 1

## 2023-12-27 MED ORDER — PIPERACILLIN-TAZOBACTAM 3.375 G IVPB 30 MIN
3.3750 g | Freq: Once | INTRAVENOUS | Status: AC
  Administered 2023-12-27: 3.375 g via INTRAVENOUS
  Filled 2023-12-27: qty 50

## 2023-12-27 NOTE — H&P (Signed)
 Elizabeth Brennan is an 43 y.o. female.   Chief Complaint: abdominal pain HPI:  Patient presents to the ED postop day 4 following laparoscopic cholecystectomy.  She underwent lap chole on December 10 by Dr. Debby.  She reports that she never really felt great after surgery.  She started feeling worse yesterday and today.  Today she felt like her abdomen was distended and that she felt a bit short of breath.  She has has been having some nausea and vomiting.  She denies diarrhea, she has been having sweats.  She is still passing gas.  She does not think that her urine has significantly changed color.  Past Medical History:  Diagnosis Date   Back pain    from disc problem neck and low back   Medical history non-contributory     Past Surgical History:  Procedure Laterality Date   ANTERIOR CERVICAL DECOMP/DISCECTOMY FUSION N/A 11/08/2021   Procedure: ACDF - C4-C5 - C5-C6;  Surgeon: Louis Shove, MD;  Location: [facility] OR;  Service: Neurosurgery;  Laterality: N/A;  3C   APPENDECTOMY     CESAREAN SECTION N/A 02/02/2018   Procedure: CESAREAN SECTION;  Surgeon: Barbra Lang PARAS, DO;  Location: [facility] SU[facility];  Service: Obstetrics;  Laterality: N/A;   CHOLECYSTECTOMY N/A 12/23/2023   Procedure: LAPAROSCOPIC CHOLECYSTECTOMY;  Surgeon: Debby Hila, MD;  Location: [facility] ORS;  Service: General;  Laterality: N/A;  ICG    No family history on file. Social History:  reports that she has never smoked. She has never used smokeless tobacco. She reports that she does not drink alcohol and does not use drugs.  Allergies: Allergies[1]  Meds: Ibuprofen  Oxycodone  Robaxin  protonix   Results for orders placed or performed during the hospital encounter of 12/27/23 (from the past 48 hours)  CBC with Differential     Status: None   Collection Time: 12/27/23 10:21 AM  Result Value Ref Range   WBC 7.6 4.0 - 10.5 K/uL   RBC 4.86 3.87 - 5.11 MIL/uL   Hemoglobin 13.8 12.0 - 15.0 g/dL   HCT 58.6 63.9 - 53.9 %   MCV 85.0  80.0 - 100.0 fL   MCH 28.4 26.0 - 34.0 pg   MCHC 33.4 30.0 - 36.0 g/dL   RDW 87.2 88.4 - 84.4 %   Platelets 313 150 - 400 K/uL   nRBC 0.0 0.0 - 0.2 %   Neutrophils Relative % 73 %   Neutro Abs 5.5 1.7 - 7.7 K/uL   Lymphocytes Relative 18 %   Lymphs Abs 1.3 0.7 - 4.0 K/uL   Monocytes Relative 7 %   Monocytes Absolute 0.5 0.1 - 1.0 K/uL   Eosinophils Relative 2 %   Eosinophils Absolute 0.2 0.0 - 0.5 K/uL   Basophils Relative 0 %   Basophils Absolute 0.0 0.0 - 0.1 K/uL   Immature Granulocytes 0 %   Abs Immature Granulocytes 0.02 0.00 - 0.07 K/uL    Comment: Performed at [facility], [address] [place], [place] [address]  Comprehensive metabolic panel     Status: Abnormal   Collection Time: 12/27/23 10:21 AM  Result Value Ref Range   Sodium 135 135 - 145 mmol/L   Potassium 3.3 (L) 3.5 - 5.1 mmol/L   Chloride 99 98 - 111 mmol/L   CO2 25 22 - 32 mmol/L   Glucose, Bld 120 (H) 70 - 99 mg/dL    Comment: Glucose reference range applies only to samples taken after fasting for at least 8 hours.  BUN 13 6 - 20 mg/dL   Creatinine, Ser 9.43 0.44 - 1.00 mg/dL   Calcium 9.7 8.9 - 89.6 mg/dL   Total Protein 8.3 (H) 6.5 - 8.1 g/dL   Albumin 3.5 3.5 - 5.0 g/dL   AST 39 15 - 41 U/L   ALT 43 0 - 44 U/L   Alkaline Phosphatase 349 (H) 38 - 126 U/L   Total Bilirubin 1.3 (H) 0.0 - 1.2 mg/dL   GFR, Estimated >39 >39 mL/min    Comment: (NOTE) Calculated using the CKD-EPI Creatinine Equation (2021)    Anion gap 12 5 - 15    Comment: Performed at [facility] [facility], [address], [place], [place] [address]  Lipase, blood     Status: None   Collection Time: 12/27/23 10:21 AM  Result Value Ref Range   Lipase 13 11 - 51 U/L    Comment: Performed at [facility], [address] [place], [place] [address]  Urinalysis, Routine w reflex microscopic -Urine, Clean Catch     Status: Abnormal   Collection Time: 12/27/23 10:21 AM  Result Value Ref  Range   Color, Urine AMBER (A) YELLOW    Comment: BIOCHEMICALS MAY BE AFFECTED BY COLOR   APPearance CLOUDY (A) CLEAR   Specific Gravity, Urine 1.035 (H) 1.005 - 1.030   pH 5.0 5.0 - 8.0   Glucose, UA NEGATIVE NEGATIVE mg/dL   Hgb urine dipstick LARGE (A) NEGATIVE   Bilirubin Urine SMALL (A) NEGATIVE   Ketones, ur 20 (A) NEGATIVE mg/dL   Protein, ur 899 (A) NEGATIVE mg/dL   Nitrite NEGATIVE NEGATIVE   Leukocytes,Ua TRACE (A) NEGATIVE   RBC / HPF >50 0 - 5 RBC/hpf   WBC, UA 21-50 0 - 5 WBC/hpf   Bacteria, UA RARE (A) NONE SEEN   Squamous Epithelial / HPF 11-20 0 - 5 /HPF   Mucus PRESENT    Hyaline Casts, UA PRESENT    Non Squamous Epithelial 0-5 (A) NONE SEEN    Comment: Performed at [facility], [address] [place], [place] [address]  I-Stat CG4 Lactic Acid     Status: None   Collection Time: 12/27/23 10:30 AM  Result Value Ref Range   Lactic Acid, Venous 0.9 0.5 - 1.9 mmol/L   NM HEPATOBILIARY LEAK (POST-SURGICAL) Result Date: 12/27/2023 EXAM: NM HEPATOBILLARY SCAN 12/27/2023 04:18:21 PM TECHNIQUE: RADIOPHARMACEUTICAL: 5.4 mCi Tc-34m mebrofenin  Dynamic images of the abdomen and pelvis were obtained in the anterior projection for 1 hour after intravenous administration of radiopharmaceutical. Additional delayed images are obtained up to 4 hours. COMPARISON: CT abdomen and pelvis 12/27/2023. CLINICAL HISTORY: Abdominal pain, post surgery or trauma, bile leak suspected. FINDINGS: Homogenous uptake within the liver. Normal clearance of the blood pool. Gallbladder surgically absent. There is no radiotracer uptake within the gallbladder fossa. Activity is visualized in the small bowel. There is abnormal radiotracer uptake projecting over the left side of the liver likely corresponding to subcapsular fluid collection along the left lateral margin of the liver. IMPRESSION: 1. Abnormal radiotracer activity along the left lateral hepatic margin corresponding to subcapsular  fluid collection along the left lateral margin of the liver. Findings are worrisome for biloma/bile duct. Electronically signed by: Greig Pique MD 12/27/2023 06:26 PM EST RP Workstation: [id number]   CT ABDOMEN PELVIS W CONTRAST Result Date: 12/27/2023 CLINICAL DATA:  Abdominal abdominal pain 4 days after lap cholecystectomy. Nausea and vomiting. Inability to tolerate p.o. intake. EXAM: CT ABDOMEN AND PELVIS WITH  CONTRAST TECHNIQUE: Multidetector CT imaging of the abdomen and pelvis was performed using the standard protocol following bolus administration of intravenous contrast. RADIATION DOSE REDUCTION: This exam was performed according to the departmental dose-optimization program which includes automated exposure control, adjustment of the mA and/or kV according to patient size and/or use of iterative reconstruction technique. CONTRAST:  OMNIPAQUE  IOHEXOL  300 MG/ML  SOLN COMPARISON:  None Available. FINDINGS: Lower chest: Dependent atelectasis noted in the lung bases with trace left pleural effusion. Hepatobiliary: Subcapsular low-density fluid collection identified along the medial liver with a 6.2 x 4.6 cm oval component posterior to the lateral segment left liver tracking posterior to the medial segment left liver and into a somewhat bilobed component measuring 11.4 x 6.6 cm medial to the right liver extending into the gallbladder fossa. Surgical clips are noted in the gallbladder fossa. No intra or extrahepatic biliary duct dilatation. Pancreas: No focal mass lesion. No dilatation of the main duct. No intraparenchymal cyst. No peripancreatic edema. Spleen: No splenomegaly. No suspicious focal mass lesion. Trace perisplenic fluid evident. Adrenals/Urinary Tract: No adrenal nodule or mass. Kidneys unremarkable. No evidence for hydroureter. The urinary bladder appears normal for the degree of distention. Stomach/Bowel: Wall thickening is noted in the distal stomach with probable mural edema in the  duodenum. No evidence for small bowel obstruction. The terminal ileum is normal. Nonvisualization of the appendix is consistent with the reported history of appendectomy. No gross colonic mass. No colonic wall thickening. Vascular/Lymphatic: No abdominal aortic aneurysm. No abdominal aortic atherosclerotic calcification. There is no gastrohepatic or hepatoduodenal ligament lymphadenopathy. No retroperitoneal or mesenteric lymphadenopathy. No pelvic sidewall lymphadenopathy. Reproductive: The uterus is unremarkable.  There is no adnexal mass. Other: Small volume free fluid in the pelvis. Musculoskeletal: No worrisome lytic or sclerotic osseous abnormality. IMPRESSION: 1. Subcapsular low-density fluid collection along posterior left liver and medial right liver extending into the gallbladder fossa. Imaging features are nonspecific but given the history of recent cholecystectomy, bile leak is a distinct concern. Given low-density of the fluid, hematoma is considered less likely. Given the size and mass-effect on the liver parenchyma, seroma also considered less likely. Nuclear scintigraphy may prove helpful to further evaluate. 2. Wall thickening in the distal stomach with probable mural edema in the duodenum. Findings are felt to be secondary. 3. Small volume free fluid around the spleen and in the pelvis. 4. Trace left pleural effusion with dependent atelectasis in the lung bases. Electronically Signed   By: Camellia Candle M.D.   On: 12/27/2023 12:49    Review of Systems  Constitutional:        Sweats  Respiratory:  Positive for shortness of breath.   Gastrointestinal:  Positive for abdominal distention, nausea and vomiting.  All other systems reviewed and are negative.   Blood pressure 113/78, pulse 83, temperature 98.6 F (37 C), temperature source Oral, resp. rate 18, last menstrual period 12/09/2023, SpO2 99%. Physical Exam Vitals reviewed.  Constitutional:      General: She is in acute distress  (looks uncomfortable but not in significant disress).     Appearance: She is well-developed and normal weight. She is ill-appearing.  HENT:     Head: Normocephalic and atraumatic.     Mouth/Throat:     Mouth: Mucous membranes are moist.  Eyes:     General: Scleral icterus present.     Extraocular Movements: Extraocular movements intact.     Pupils: Pupils are equal, round, and reactive to light.  Cardiovascular:  Rate and Rhythm: Normal rate and regular rhythm.     Heart sounds: Normal heart sounds. No murmur heard. Pulmonary:     Effort: Pulmonary effort is normal. No respiratory distress.     Breath sounds: Normal breath sounds. No stridor. No wheezing or rhonchi.  Chest:     Chest wall: No tenderness.  Abdominal:     General: Bowel sounds are decreased. There is distension. There is no abdominal bruit. There are no signs of injury.     Palpations: Abdomen is soft. There is no shifting dullness, fluid wave, hepatomegaly, splenomegaly or mass.     Tenderness: There is abdominal tenderness in the epigastric area. There is no guarding or rebound.  Skin:    Capillary Refill: Capillary refill takes 2 to 3 seconds.     Coloration: Skin is not cyanotic, jaundiced, mottled or pale.     Comments: Flushed cheeks, fingertips henna colored  Neurological:     General: No focal deficit present.     Mental Status: She is alert and oriented to person, place, and time.     Cranial Nerves: No cranial nerve deficit.     Motor: No weakness.  Psychiatric:        Mood and Affect: Mood normal. Mood is not anxious or depressed.        Behavior: Behavior normal.      Assessment/Plan Bile leak after lap chole Hypokalemia Elevated LFTs  IV fluids IV antibiotics NPO after midnight Nausea/pain control Consult IR for perc drain May need ercp/stent if drain inadequate or if large volume leak  Discussed with patient and spouse with video arabic interpreter.    Jina LITTIE Nephew, MD, FACS,  FSSO Surgical Oncology, General Surgery, Trauma and Critical [facility], [place] [phone] for weekday/non holidays Check [email or web address] for coverage night/weekend/holidays under General Surgery         [1]  Allergies Allergen Reactions   Porcine (Pork) Protein-Containing Drug Products Other (See Comments)    Cultural value

## 2023-12-27 NOTE — ED Notes (Signed)
 Vital signs delayed due to testing

## 2023-12-27 NOTE — ED Provider Notes (Signed)
 Received patient has undergone previous provider pending HIDA scan.  See their note.  In short, patient presents Emergency Department for evaluation of RUQ and epigastric pain that has been going on for the past 4 days.  She is status post cholecystectomy.  Lab work notable for no leukocytosis.  Elevated ALP at 349 and total bili 1.3.  Was provided Zosyn , IVF, fentanyl , Protonix , Zofran  in ED  CT imaging concerning for bile leak.  General surgery Dr. Aron was consulted by previous provider who recommended HIDA rescan in ED and to call them that all results   Physical Exam  BP 106/80   Pulse 80   Temp 97.9 F (36.6 C) (Oral)   Resp 16   LMP 12/09/2023 (Exact Date)   SpO2 97%   ED Course / MDM   Clinical Course as of 12/27/23 1504  Sun Dec 27, 2023  1226 Leukocytes,Ua(!): TRACE [JS]  1226 WBC, UA: 21-50 [JS]  1226 Hgb urine dipstick(!): LARGE [JS]  1301 Alkaline Phosphatase(!): 349 [JS]  1452 Potassium(!): 3.3 [JS]    Clinical Course User Index [JS] Soto, Johana, PA-C   Medical Decision Making Amount and/or Complexity of Data Reviewed Labs: ordered. Decision-making details documented in ED Course. Radiology: ordered.  Risk Prescription drug management. Decision regarding hospitalization.   Will admit to general surgery. Dr. Jina Aron accepts patient for admission    Elizabeth Brennan, Elizabeth 12/27/23 Brennan    Elizabeth Faden, MD 12/29/23 [phone]

## 2023-12-27 NOTE — ED Provider Notes (Signed)
 [place] EMERGENCY DEPARTMENT AT [facility] Provider Note   CSN: [id number] Arrival date & time: 12/27/23  9092     Patient presents with: Abdominal Pain and Nausea   Elizabeth Brennan is a 43 y.o. female.  {Add pertinent medical, surgical, social history, OB history to HPI:32947} 43 y.o female with a PMH of Cholecystevtomy by Dr. Bernarda Ned presents to the ED with a chief complaint of epigastric pain that is been ongoing for the past 4 days.  Patient had a cholecystectomy, reports she went home began to feel some cramping stabbing sensation to the epigastric region without any radiation.  Symptoms are exacerbated with any type of oral intake.  She tells me that she drinks water and has to sit up in order to allow the water to pass.  She has been taking ibuprofen  along with medication that she was prescribed by her surgeon without any improvement in her symptoms.  There is no alleviating factors.  She is endorsing some shortness of breath, states that this happens whenever the pain is exacerbated in nature.  She is also endorsing some nausea.  No fever, no vomiting, normal bowel movements. No chest pain.   The history is provided by the patient. A language interpreter was used Lessie [phone]).  Abdominal Pain Pain location:  Epigastric Pain quality: stabbing   Pain radiates to:  Does not radiate Pain severity:  Moderate Onset quality:  Gradual Duration:  2 days Timing:  Intermittent Chronicity:  New Context: previous surgery   Worsened by:  Nothing Ineffective treatments:  Acetaminophen  Associated symptoms: nausea and shortness of breath   Associated symptoms: no chest pain, no chills, no diarrhea, no fever, no sore throat and no vomiting        Prior to Admission medications  Medication Sig Start Date End Date Taking? Authorizing Provider  acetaminophen  (TYLENOL ) 500 MG tablet Take 2 tablets (1,000 mg total) by mouth every 6 (six) hours as needed for mild pain (pain  score 1-3). 12/23/23 12/22/24  Augustus Almarie RAMAN, PA-C  ibuprofen  (ADVIL ) 200 MG tablet Take 200 mg by mouth every 6 (six) hours as needed for moderate pain. Pt states she takes up to 1,000mg  over the course of the day.    [provider]  methocarbamol  (ROBAXIN ) 500 MG tablet Take 1 tablet (500 mg total) by mouth 2 (two) times daily. 12/19/23   Leatrice Vernell HERO, NP  omeprazole  (PRILOSEC) 40 MG capsule Take 1 capsule (40 mg total) by mouth daily. 12/19/23   Leatrice Vernell HERO, NP  oxyCODONE  (OXY IR/ROXICODONE ) 5 MG immediate release tablet Take 1 tablet (5 mg total) by mouth every 6 (six) hours as needed for severe pain (pain score 7-10). 12/23/23   Augustus Almarie RAMAN, PA-C    Allergies: Porcine (pork) protein-containing drug products    Review of Systems  Constitutional:  Negative for chills and fever.  HENT:  Negative for sore throat.   Respiratory:  Positive for shortness of breath.   Cardiovascular:  Negative for chest pain.  Gastrointestinal:  Positive for abdominal pain and nausea. Negative for diarrhea and vomiting.  Genitourinary:  Negative for flank pain.  All other systems reviewed and are negative.   Updated Vital Signs BP 106/80   Pulse 80   Temp 97.9 F (36.6 C) (Oral)   Resp 16   LMP 12/09/2023 (Exact Date)   SpO2 97%   Physical Exam Vitals and nursing note reviewed.  Constitutional:      General: She is  not in acute distress.    Appearance: She is well-developed.  HENT:     Head: Normocephalic and atraumatic.     Mouth/Throat:     Pharynx: No oropharyngeal exudate.  Eyes:     Pupils: Pupils are equal, round, and reactive to light.  Cardiovascular:     Rate and Rhythm: Regular rhythm.     Heart sounds: Normal heart sounds.     Comments: No pitting edema bilaterally. Pulmonary:     Effort: Pulmonary effort is normal. No respiratory distress.     Breath sounds: Normal breath sounds.     Comments: Lungs are clear to auscultation without any  wheezing or rales. Abdominal:     General: Bowel sounds are decreased. There is no distension.     Palpations: Abdomen is soft.     Tenderness: There is abdominal tenderness in the right upper quadrant and epigastric area.     Comments: Bowel sounds are somewhat decreased, there is severe tenderness to palpation along the epigastric and right upper quadrant.  Musculoskeletal:        General: No tenderness or deformity.     Cervical back: Normal range of motion.     Right lower leg: No edema.     Left lower leg: No edema.  Skin:    General: Skin is warm and dry.  Neurological:     Mental Status: She is alert and oriented to person, place, and time.     (all labs ordered are listed, but only abnormal results are displayed) Labs Reviewed  COMPREHENSIVE METABOLIC PANEL WITH GFR - Abnormal; Notable for the following components:      Result Value   Potassium 3.3 (*)    Glucose, Bld 120 (*)    Total Protein 8.3 (*)    Alkaline Phosphatase 349 (*)    Total Bilirubin 1.3 (*)    All other components within normal limits  URINALYSIS, ROUTINE W REFLEX MICROSCOPIC - Abnormal; Notable for the following components:   Color, Urine AMBER (*)    APPearance CLOUDY (*)    Specific Gravity, Urine 1.035 (*)    Hgb urine dipstick LARGE (*)    Bilirubin Urine SMALL (*)    Ketones, ur 20 (*)    Protein, ur 100 (*)    Leukocytes,Ua TRACE (*)    Bacteria, UA RARE (*)    Non Squamous Epithelial 0-5 (*)    All other components within normal limits  CULTURE, BLOOD (ROUTINE X 2)  CULTURE, BLOOD (ROUTINE X 2)  CBC WITH DIFFERENTIAL/PLATELET  LIPASE, BLOOD  I-STAT CG4 LACTIC ACID, ED  I-STAT CG4 LACTIC ACID, ED    EKG: None  Radiology: CT ABDOMEN PELVIS W CONTRAST Result Date: 12/27/2023 CLINICAL DATA:  Abdominal abdominal pain 4 days after lap cholecystectomy. Nausea and vomiting. Inability to tolerate p.o. intake. EXAM: CT ABDOMEN AND PELVIS WITH CONTRAST TECHNIQUE: Multidetector CT imaging of  the abdomen and pelvis was performed using the standard protocol following bolus administration of intravenous contrast. RADIATION DOSE REDUCTION: This exam was performed according to the departmental dose-optimization program which includes automated exposure control, adjustment of the mA and/or kV according to patient size and/or use of iterative reconstruction technique. CONTRAST:  OMNIPAQUE  IOHEXOL  300 MG/ML  SOLN COMPARISON:  None Available. FINDINGS: Lower chest: Dependent atelectasis noted in the lung bases with trace left pleural effusion. Hepatobiliary: Subcapsular low-density fluid collection identified along the medial liver with a 6.2 x 4.6 cm oval component posterior to the lateral segment left  liver tracking posterior to the medial segment left liver and into a somewhat bilobed component measuring 11.4 x 6.6 cm medial to the right liver extending into the gallbladder fossa. Surgical clips are noted in the gallbladder fossa. No intra or extrahepatic biliary duct dilatation. Pancreas: No focal mass lesion. No dilatation of the main duct. No intraparenchymal cyst. No peripancreatic edema. Spleen: No splenomegaly. No suspicious focal mass lesion. Trace perisplenic fluid evident. Adrenals/Urinary Tract: No adrenal nodule or mass. Kidneys unremarkable. No evidence for hydroureter. The urinary bladder appears normal for the degree of distention. Stomach/Bowel: Wall thickening is noted in the distal stomach with probable mural edema in the duodenum. No evidence for small bowel obstruction. The terminal ileum is normal. Nonvisualization of the appendix is consistent with the reported history of appendectomy. No gross colonic mass. No colonic wall thickening. Vascular/Lymphatic: No abdominal aortic aneurysm. No abdominal aortic atherosclerotic calcification. There is no gastrohepatic or hepatoduodenal ligament lymphadenopathy. No retroperitoneal or mesenteric lymphadenopathy. No pelvic sidewall  lymphadenopathy. Reproductive: The uterus is unremarkable.  There is no adnexal mass. Other: Small volume free fluid in the pelvis. Musculoskeletal: No worrisome lytic or sclerotic osseous abnormality. IMPRESSION: 1. Subcapsular low-density fluid collection along posterior left liver and medial right liver extending into the gallbladder fossa. Imaging features are nonspecific but given the history of recent cholecystectomy, bile leak is a distinct concern. Given low-density of the fluid, hematoma is considered less likely. Given the size and mass-effect on the liver parenchyma, seroma also considered less likely. Nuclear scintigraphy may prove helpful to further evaluate. 2. Wall thickening in the distal stomach with probable mural edema in the duodenum. Findings are felt to be secondary. 3. Small volume free fluid around the spleen and in the pelvis. 4. Trace left pleural effusion with dependent atelectasis in the lung bases. Electronically Signed   By: Camellia Candle M.D.   On: 12/27/2023 12:49    {Document cardiac monitor, telemetry assessment procedure when appropriate:32947} Procedures   Medications Ordered in the ED  ondansetron  (ZOFRAN ) injection 4 mg (4 mg Intravenous Given 12/27/23 1016)  fentaNYL  (SUBLIMAZE ) injection 50 mcg (50 mcg Intravenous Given 12/27/23 1016)  sodium chloride  0.9 % bolus 1,000 mL (0 mLs Intravenous Stopped 12/27/23 1320)  pantoprazole  (PROTONIX ) injection 40 mg (40 mg Intravenous Given 12/27/23 1054)  iohexol  (OMNIPAQUE ) 300 MG/ML solution 100 mL (100 mLs Intravenous Contrast Given 12/27/23 1159)  piperacillin -tazobactam (ZOSYN ) IVPB 3.375 g (0 g Intravenous Stopped 12/27/23 1350)    Clinical Course as of 12/27/23 1500  Sun Dec 27, 2023  1226 Leukocytes,Ua(!): TRACE [JS]  1226 WBC, UA: 21-50 [JS]  1226 Hgb urine dipstick(!): LARGE [JS]  1301 Alkaline Phosphatase(!): 349 [JS]  1452 Potassium(!): 3.3 [JS]    Clinical Course User Index [JS] Rogers Ditter, PA-C    {Click here for ABCD2, HEART and other calculators REFRESH Note before signing:1}                              Medical Decision Making Amount and/or Complexity of Data Reviewed Labs: ordered. Decision-making details documented in ED Course. Radiology: ordered.  Risk Prescription drug management.   This patient presents to the ED for concern of nausea, epigastric pain, this involves a number of treatment options, and is a complaint that carries with it a high risk of complications and morbidity.  The differential diagnosis includes postop infection versus complication.   Co morbidities: Discussed in HPI   Brief History:  See HPI.  EMR reviewed including pt PMHx, past surgical history and past visits to ER.   See HPI for more details   Lab Tests:  I ordered and independently interpreted labs.  The pertinent results include:    CBC with no leukocytosis.  Hemoglobin is at her baseline.  CMP remarkable for some mild hide but okay Leamy a.  Creatinine levels within normal limits.  LFTs are unremarkable however alk phos is elevated along with total bili.  Lipase levels normal.  Urinalysis with 21-50 white blood cell count, rare bacteria but trace of leukocytes.  She is not having any urinary symptoms at this time.  Imaging Studies:  CT abdomen and pelvis showed: IMPRESSION:  1. Subcapsular low-density fluid collection along posterior left  liver and medial right liver extending into the gallbladder fossa.  Imaging features are nonspecific but given the history of recent  cholecystectomy, bile leak is a distinct concern. Given low-density  of the fluid, hematoma is considered less likely. Given the size and  mass-effect on the liver parenchyma, seroma also considered less  likely. Nuclear scintigraphy may prove helpful to further evaluate.  2. Wall thickening in the distal stomach with probable mural edema  in the duodenum. Findings are felt to be secondary.  3. Small volume  free fluid around the spleen and in the pelvis.  4. Trace left pleural effusion with dependent atelectasis in the  lung bases.   HIDA scan pending.   Medicines ordered:  I ordered medication including fentanyl , Protonix , Zofran , fluids for symptomatic treatment. Reevaluation of the patient after these medicines showed that the patient improved I have reviewed the patients home medicines and have made adjustments as needed   Critical Interventions:   Patient given Zosyn  as she is postop cholecystectomy, concern for infection versus biliary leak.  Consults:  1:30 PM I requested consultation with Dr. Aron,  and discussed lab and imaging findings as well as pertinent plan - they recommend: HIDA scan while in the ED, please call her with results.   Reevaluation:  After the interventions noted above I re-evaluated patient and found that they have :improved  Social Determinants of Health:  The patient's social determinants of health were a factor in the care of this patient  Problem List / ED Course:  Patient presenting POD4 with epigastric pain along with nausea exacerbated with any type of oral intake.  Prior cholecystectomy by Dr. Bernarda Ned.  Has been keeping her pain at bay with over-the-counter medication along with one of her prescription medicine.  Reports worsening pain with any type of oral intake whether solid versus liquid.  Has not had any fevers at home.  Appears pretty uncomfortable on exam with some significant tenderness to palpation along the epigastric region.  No lower abdominal pain.  No urinary symptoms.  Here without any leukocytosis.  Hemoglobin is stable.  CMP with no electrolyte derangement, creatinine levels unremarkable.  She does have elevation of her alk phos, along with total bili.  Lipase level is normal.  CT abdomen and pelvis obtained to rule out postop infection versus biliary leak.  There is some concern for extra fluid noted.  I did discuss this case  with general surgery Dr. Aron, she recommended HIDA scan while in the emergency department.  She will need to be called with results. Patient was given fluids, fentanyl , Zofran  for symptomatic treatment.  A dose of Zosyn  was also started as there is concern for intra-abdominal pathology.  Patient remains hemodynamically stable.  Dispostion:  Patient care signed out to Lauren B PA at shift change pending HIDA SCAN and results to Dr. Aron.     Portions of this note were generated with Scientist, clinical (histocompatibility and immunogenetics). Dictation errors may occur despite best attempts at proofreading.   Final diagnoses:  Post-op pain  Epigastric abdominal pain    ED Discharge Orders     None

## 2023-12-27 NOTE — ED Triage Notes (Signed)
 iPad interpreter used, Arabic speaking.   Pt arrives via wheelchair accompanied by husband. PT reports Lap Chole recently. Pt has complaints of abdominal pain, and inability to tolerate PO intake. Endorses nausea, denies vomiting, denies diarrhea.

## 2023-12-28 ENCOUNTER — Inpatient Hospital Stay (HOSPITAL_COMMUNITY): Payer: Self-pay

## 2023-12-28 LAB — CBC
HCT: 33.9 % — ABNORMAL LOW (ref 36.0–46.0)
Hemoglobin: 11.2 g/dL — ABNORMAL LOW (ref 12.0–15.0)
MCH: 28 pg (ref 26.0–34.0)
MCHC: 33 g/dL (ref 30.0–36.0)
MCV: 84.8 fL (ref 80.0–100.0)
Platelets: 251 K/uL (ref 150–400)
RBC: 4 MIL/uL (ref 3.87–5.11)
RDW: 12.7 % (ref 11.5–15.5)
WBC: 6.2 K/uL (ref 4.0–10.5)
nRBC: 0 % (ref 0.0–0.2)

## 2023-12-28 LAB — COMPREHENSIVE METABOLIC PANEL WITH GFR
ALT: 27 U/L (ref 0–44)
AST: 27 U/L (ref 15–41)
Albumin: 2.9 g/dL — ABNORMAL LOW (ref 3.5–5.0)
Alkaline Phosphatase: 283 U/L — ABNORMAL HIGH (ref 38–126)
Anion gap: 9 (ref 5–15)
BUN: 10 mg/dL (ref 6–20)
CO2: 24 mmol/L (ref 22–32)
Calcium: 8.6 mg/dL — ABNORMAL LOW (ref 8.9–10.3)
Chloride: 101 mmol/L (ref 98–111)
Creatinine, Ser: 0.57 mg/dL (ref 0.44–1.00)
GFR, Estimated: >60 mL/min (ref >60)
Glucose, Bld: 170 mg/dL — ABNORMAL HIGH (ref 70–99)
Potassium: 3.2 mmol/L — ABNORMAL LOW (ref 3.5–5.1)
Sodium: 134 mmol/L — ABNORMAL LOW (ref 135–145)
Total Bilirubin: 0.8 mg/dL (ref 0.0–1.2)
Total Protein: 6.7 g/dL (ref 6.5–8.1)

## 2023-12-28 LAB — PROTIME-INR
INR: 1.1 (ref 0.8–1.2)
Prothrombin Time: 15.3 s — ABNORMAL HIGH (ref 11.4–15.2)

## 2023-12-28 LAB — HIV ANTIBODY (ROUTINE TESTING W REFLEX): HIV Screen 4th Generation wRfx: NONREACTIVE

## 2023-12-28 MED ORDER — FENTANYL CITRATE (PF) 100 MCG/2ML IJ SOLN
INTRAMUSCULAR | Status: AC
  Filled 2023-12-28: qty 2

## 2023-12-28 MED ORDER — FENTANYL CITRATE (PF) 100 MCG/2ML IJ SOLN
INTRAMUSCULAR | Status: AC | PRN
  Administered 2023-12-28: 14:00:00 50 ug via INTRAVENOUS

## 2023-12-28 MED ORDER — HYDROMORPHONE HCL 1 MG/ML IJ SOLN
INTRAMUSCULAR | Status: AC | PRN
  Administered 2023-12-28: 14:00:00 1 mg via INTRAVENOUS

## 2023-12-28 MED ORDER — HYDROMORPHONE HCL 1 MG/ML IJ SOLN
INTRAMUSCULAR | Status: AC
  Filled 2023-12-28: qty 1

## 2023-12-28 MED ORDER — MIDAZOLAM HCL (PF) 2 MG/2ML IJ SOLN
INTRAMUSCULAR | Status: AC | PRN
  Administered 2023-12-28: 14:00:00 1 mg via INTRAVENOUS

## 2023-12-28 MED ORDER — SODIUM CHLORIDE 0.9 % IV SOLN
INTRAVENOUS | Status: AC
  Filled 2023-12-28: qty 500

## 2023-12-28 MED ORDER — KCL IN DEXTROSE-NACL 20-5-0.45 MEQ/L-%-% IV SOLN
INTRAVENOUS | Status: DC
  Filled 2023-12-28 (×5): qty 1000

## 2023-12-28 MED ORDER — OXYCODONE HCL 5 MG PO TABS
5.0000 mg | ORAL_TABLET | ORAL | Status: DC | PRN
  Administered 2023-12-28: 20:00:00 5 mg via ORAL
  Filled 2023-12-28: qty 1

## 2023-12-28 MED ORDER — MIDAZOLAM HCL 2 MG/2ML IJ SOLN
INTRAMUSCULAR | Status: AC
  Filled 2023-12-28: qty 2

## 2023-12-28 MED ORDER — MIDAZOLAM HCL (PF) 2 MG/2ML IJ SOLN
INTRAMUSCULAR | Status: AC | PRN
  Administered 2023-12-28: 15:00:00 1 mg via INTRAVENOUS

## 2023-12-28 MED ORDER — SODIUM CHLORIDE 0.9% FLUSH
5.0000 mL | Freq: Three times a day (TID) | INTRAVENOUS | Status: DC
  Administered 2023-12-28 – 2023-12-30 (×6): 5 mL

## 2023-12-28 MED ORDER — FENTANYL CITRATE (PF) 100 MCG/2ML IJ SOLN
INTRAMUSCULAR | Status: AC | PRN
  Administered 2023-12-28: 15:00:00 50 ug via INTRAVENOUS

## 2023-12-28 NOTE — Progress Notes (Signed)
°   12/28/23 0837  TOC Brief Assessment  Insurance and Status Lapsed  Patient has primary care physician Yes  Home environment has been reviewed single family home  Prior level of function: independent  Prior/Current Home Services No current home services  Social Drivers of Health Review SDOH reviewed no interventions necessary  Readmission risk has been reviewed Yes  Transition of care needs no transition of care needs at this time    Pt has no active health insurance. Pt has PCP listed in chart. No TOC needs at this time.  Signed: Heather Saltness, MSW, LCSW Clinical Social Worker Inpatient Care Management 12/28/2023 8:38 AM

## 2023-12-28 NOTE — Plan of Care (Signed)

## 2023-12-28 NOTE — Progress Notes (Addendum)
 Subjective: CC: Arabic interpreter used.  Patient with left sided and epigastric abdominal pain with associated nausea. No vomiting.   Afebrile. No tachycardia. BP 94/64. RN to repeat.  WBC wnl. Lactic wnl Hgb 11.2 from 13.8. On IVF at 100ml/hr Na 134. K 3.2.  Alk Phos 283 (349).  AST 27, ALT 27. T. Bili 0.8.   Objective: Vital signs in last 24 hours: Temp:  [97.9 F (36.6 C)-98.6 F (37 C)] 98.1 F (36.7 C) (12/15 0530) Pulse Rate:  [80-121] 88 (12/15 0530) Resp:  [16-19] 19 (12/15 0530) BP: (94-122)/(62-91) 94/64 (12/15 0530) SpO2:  [92 %-99 %] 92 % (12/15 0530) Weight:  [75 kg] 75 kg (12/14 2312)    Intake/Output from previous day: 12/14 0701 - 12/15 0700 In: 1237 [P.O.:120; I.V.:25.2; IV Piggyback:1091.8] Out: -  Intake/Output this shift: No intake/output data recorded.  PE: Gen:  Alert, NAD, pleasant Card:  Reg Pulm:  Rate and effort normal Abd: Soft, mild distension, left sided and epigastric ttp without rigidity or guarding. Incisions with glue intact appears well and are without drainage, bleeding, or signs of infection.  Psych: A&Ox3   Lab Results:  Recent Labs    12/27/23 1021 12/28/23 0415  WBC 7.6 6.2  HGB 13.8 11.2*  HCT 41.3 33.9*  PLT 313 251   BMET Recent Labs    12/27/23 1021 12/28/23 0415  NA 135 134*  K 3.3* 3.2*  CL 99 101  CO2 25 24  GLUCOSE 120* 170*  BUN 13 10  CREATININE 0.56 0.57  CALCIUM 9.7 8.6*   PT/INR Recent Labs    12/28/23 0415  LABPROT 15.3*  INR 1.1   CMP     Component Value Date/Time   NA 134 (L) 12/28/2023 0415   K 3.2 (L) 12/28/2023 0415   CL 101 12/28/2023 0415   CO2 24 12/28/2023 0415   GLUCOSE 170 (H) 12/28/2023 0415   BUN 10 12/28/2023 0415   CREATININE 0.57 12/28/2023 0415   CALCIUM 8.6 (L) 12/28/2023 0415   PROT 6.7 12/28/2023 0415   ALBUMIN 2.9 (L) 12/28/2023 0415   AST 27 12/28/2023 0415   ALT 27 12/28/2023 0415   ALKPHOS 283 (H) 12/28/2023 0415   BILITOT 0.8 12/28/2023 0415    GFRNONAA >60 12/28/2023 0415   GFRAA >60 11/15/2016 1327   Lipase     Component Value Date/Time   LIPASE 13 12/27/2023 1021    Studies/Results: NM HEPATOBILIARY LEAK (POST-SURGICAL) Result Date: 12/27/2023 EXAM: NM HEPATOBILLARY SCAN 12/27/2023 04:18:21 PM TECHNIQUE: RADIOPHARMACEUTICAL: 5.4 mCi Tc-38m mebrofenin  Dynamic images of the abdomen and pelvis were obtained in the anterior projection for 1 hour after intravenous administration of radiopharmaceutical. Additional delayed images are obtained up to 4 hours. COMPARISON: CT abdomen and pelvis 12/27/2023. CLINICAL HISTORY: Abdominal pain, post surgery or trauma, bile leak suspected. FINDINGS: Homogenous uptake within the liver. Normal clearance of the blood pool. Gallbladder surgically absent. There is no radiotracer uptake within the gallbladder fossa. Activity is visualized in the small bowel. There is abnormal radiotracer uptake projecting over the left side of the liver likely corresponding to subcapsular fluid collection along the left lateral margin of the liver. IMPRESSION: 1. Abnormal radiotracer activity along the left lateral hepatic margin corresponding to subcapsular fluid collection along the left lateral margin of the liver. Findings are worrisome for biloma/bile duct. Electronically signed by: Greig Pique MD 12/27/2023 06:26 PM EST RP Workstation: [id number]   CT ABDOMEN PELVIS W CONTRAST Result Date: 12/27/2023 CLINICAL  DATA:  Abdominal abdominal pain 4 days after lap cholecystectomy. Nausea and vomiting. Inability to tolerate p.o. intake. EXAM: CT ABDOMEN AND PELVIS WITH CONTRAST TECHNIQUE: Multidetector CT imaging of the abdomen and pelvis was performed using the standard protocol following bolus administration of intravenous contrast. RADIATION DOSE REDUCTION: This exam was performed according to the departmental dose-optimization program which includes automated exposure control, adjustment of the mA and/or kV according to  patient size and/or use of iterative reconstruction technique. CONTRAST:  OMNIPAQUE  IOHEXOL  300 MG/ML  SOLN COMPARISON:  None Available. FINDINGS: Lower chest: Dependent atelectasis noted in the lung bases with trace left pleural effusion. Hepatobiliary: Subcapsular low-density fluid collection identified along the medial liver with a 6.2 x 4.6 cm oval component posterior to the lateral segment left liver tracking posterior to the medial segment left liver and into a somewhat bilobed component measuring 11.4 x 6.6 cm medial to the right liver extending into the gallbladder fossa. Surgical clips are noted in the gallbladder fossa. No intra or extrahepatic biliary duct dilatation. Pancreas: No focal mass lesion. No dilatation of the main duct. No intraparenchymal cyst. No peripancreatic edema. Spleen: No splenomegaly. No suspicious focal mass lesion. Trace perisplenic fluid evident. Adrenals/Urinary Tract: No adrenal nodule or mass. Kidneys unremarkable. No evidence for hydroureter. The urinary bladder appears normal for the degree of distention. Stomach/Bowel: Wall thickening is noted in the distal stomach with probable mural edema in the duodenum. No evidence for small bowel obstruction. The terminal ileum is normal. Nonvisualization of the appendix is consistent with the reported history of appendectomy. No gross colonic mass. No colonic wall thickening. Vascular/Lymphatic: No abdominal aortic aneurysm. No abdominal aortic atherosclerotic calcification. There is no gastrohepatic or hepatoduodenal ligament lymphadenopathy. No retroperitoneal or mesenteric lymphadenopathy. No pelvic sidewall lymphadenopathy. Reproductive: The uterus is unremarkable.  There is no adnexal mass. Other: Small volume free fluid in the pelvis. Musculoskeletal: No worrisome lytic or sclerotic osseous abnormality. IMPRESSION: 1. Subcapsular low-density fluid collection along posterior left liver and medial right liver extending into  the gallbladder fossa. Imaging features are nonspecific but given the history of recent cholecystectomy, bile leak is a distinct concern. Given low-density of the fluid, hematoma is considered less likely. Given the size and mass-effect on the liver parenchyma, seroma also considered less likely. Nuclear scintigraphy may prove helpful to further evaluate. 2. Wall thickening in the distal stomach with probable mural edema in the duodenum. Findings are felt to be secondary. 3. Small volume free fluid around the spleen and in the pelvis. 4. Trace left pleural effusion with dependent atelectasis in the lung bases. Electronically Signed   By: Camellia Candle M.D.   On: 12/27/2023 12:49    Anti-infectives: Anti-infectives (From admission, onward)    Start     Dose/Rate Route Frequency Ordered Stop   12/27/23 2200  piperacillin -tazobactam (ZOSYN ) IVPB 3.375 g        3.375 g 12.5 mL/hr over 240 Minutes Intravenous Every 8 hours 12/27/23 2044 01/03/24 2159   12/27/23 1300  piperacillin -tazobactam (ZOSYN ) IVPB 3.375 g        3.375 g 100 mL/hr over 30 Minutes Intravenous  Once 12/27/23 1253 12/27/23 1350        Assessment/Plan POD 5 s/p laparoscopic cholecystectomy by Dr. Debby on 12/10 - CT 12/14 w/ intra-abdominal fluid collection along posterior left liver and medial right liver extending into the gallbladder fossa. - HIDA c/f bile leak - IR consult for drain placement. This will also help confirm fluid is c/w bile  leak.  - Will follow drain output to see if low or high output. If high output for 48 hours consecutively, will need GI consult for ERCP and stenting  - Continue IV abx until source control  - Repeat labs in AM  FEN - NPO for IR procedure. Okay for liquids after IR procedure. Recheck BP - continue mIVF. May need bolus. Replace hypokalemia  VTE - SCDs, fondaparinux  (hold this AM until seen by IR) - recheck hgb in AM ID - Zosyn  Foley - None, spont void Plan - IR consult   LOS: 1 day     Ozell CHRISTELLA Shaper, [facility] 12/28/2023, 8:58 AM Please see Amion for pager number during day hours 7:00am-4:30pm

## 2023-12-28 NOTE — Procedures (Signed)
 Interventional Radiology Procedure:   Indications: Bile leak after cholecystectomy  Procedure: CT guided drain placement  Findings: Fluid collection along medial aspect of liver is mobile with patient positioning.  10 Fr drain placed between right kidney and inferior right hepatic lobe.  Bilious output from the drain.     Complications: None     EBL: Minimal  Plan:  Fluid sent for culture.  Follow drain output.    Brekyn Huntoon R. Philip, MD  Pager: [phone]

## 2023-12-28 NOTE — Sedation Documentation (Signed)
 RN Rosina SAUNDERS. And RN McKenzie pulled 4mg  Versed  and 200mcg Fentanyl  in and 1mg  Dilaudid  CT pyxis. Pt. Received 4mg  Versed , Fentanyl  and 1mg  Dilaudid  throughout the procedure.

## 2023-12-28 NOTE — Consult Note (Signed)
 Chief Complaint: Abdominal pain, nausea, perihepatic fluid collections concerning for bile leak post cholecystectomy; referred for image guided drainage of abdominal/perihepatic fluid collection  Referring Provider(s): Byerly,F  Supervising Physician: Philip Cornet  Patient Status: [facility] - In-pt  History of Present Illness: Elizabeth Brennan is a 43 y.o. female with past medical history significant for neck/back pain with prior cervical decompression/discectomy/fusion in 2023 who recently underwent laparoscopic cholecystectomy for cholecystitis/ obstructing cholelithiasis on 12/23/2023.  She now presents with persistent abdominal pain, nausea and CT scan revealing:  1. Subcapsular low-density fluid collection along posterior left liver and medial right liver extending into the gallbladder fossa. Imaging features are nonspecific but given the history of recent cholecystectomy, bile leak is a distinct concern. Given low-density of the fluid, hematoma is considered less likely. Given the size and mass-effect on the liver parenchyma, seroma also considered less likely. Nuclear scintigraphy may prove helpful to further evaluate. 2. Wall thickening in the distal stomach with probable mural edema in the duodenum. Findings are felt to be secondary. 3. Small volume free fluid around the spleen and in the pelvis. 4. Trace left pleural effusion with dependent atelectasis in the lung bases.   HIDA scan revealed:  1. Abnormal radiotracer activity along the left lateral hepatic margin corresponding to subcapsular fluid collection along the left lateral margin of the liver. Findings are worrisome for biloma/bile duct.   She is currently afebrile, on IV Zosyn , WBC normal, hemoglobin 11.2, platelets normal, PT 15.3, INR 1.1, creatinine normal, potassium 3.2, total bilirubin 0.8, blood culture negative to date; request now received from surgical team for image guided drainage of perihepatic/gallbladder fossa  fluid collection.   Patient is Full Code  Past Medical History:  Diagnosis Date   Back pain    from disc problem neck and low back   Medical history non-contributory     Past Surgical History:  Procedure Laterality Date   ANTERIOR CERVICAL DECOMP/DISCECTOMY FUSION N/A 11/08/2021   Procedure: ACDF - C4-C5 - C5-C6;  Surgeon: Louis Shove, MD;  Location: [facility] OR;  Service: Neurosurgery;  Laterality: N/A;  3C   APPENDECTOMY     CESAREAN SECTION N/A 02/02/2018   Procedure: CESAREAN SECTION;  Surgeon: Barbra Lang PARAS, DO;  Location: [facility] BIRTHING SUIT[facility];  Service: Obstetrics;  Laterality: N/A;   CHOLECYSTECTOMY N/A 12/23/2023   Procedure: LAPAROSCOPIC CHOLECYSTECTOMY;  Surgeon: Debby Hila, MD;  Location: [facility] ORS;  Service: General;  Laterality: N/A;  ICG    Allergies: Porcine (pork) protein-containing drug products  Medications: Prior to Admission medications  Medication Sig Start Date End Date Taking? Authorizing Provider  acetaminophen  (TYLENOL ) 500 MG tablet Take 2 tablets (1,000 mg total) by mouth every 6 (six) hours as needed for mild pain (pain score 1-3). 12/23/23 12/22/24  Augustus Almarie RAMAN, PA-C  ibuprofen  (ADVIL ) 200 MG tablet Take 200 mg by mouth every 6 (six) hours as needed for moderate pain. Pt states she takes up to 1,000mg  over the course of the day.    [provider]  methocarbamol  (ROBAXIN ) 500 MG tablet Take 1 tablet (500 mg total) by mouth 2 (two) times daily. 12/19/23   Leatrice Vernell HERO, NP  omeprazole  (PRILOSEC) 40 MG capsule Take 1 capsule (40 mg total) by mouth daily. 12/19/23   Leatrice Vernell HERO, NP  oxyCODONE  (OXY IR/ROXICODONE ) 5 MG immediate release tablet Take 1 tablet (5 mg total) by mouth every 6 (six) hours as needed for severe pain (pain score 7-10). 12/23/23   Augustus Almarie RAMAN, PA-C  History reviewed. No pertinent family history.  Social History   Socioeconomic History   Marital status: Married    Spouse name: Alkhder,Mohammed    Number of children: 5   Years of education: Not on file   Highest education level: Not on file  Occupational History   Not on file  Tobacco Use   Smoking status: Never   Smokeless tobacco: Never  Vaping Use   Vaping status: Never Used  Substance and Sexual Activity   Alcohol use: No   Drug use: No   Sexual activity: Yes    Birth control/protection: None  Other Topics Concern   Not on file  Social History Narrative   Not on file   Social Drivers of Health   Tobacco Use: Low Risk (12/27/2023)   Patient History    Smoking Tobacco Use: Never    Smokeless Tobacco Use: Never    Passive Exposure: Not on file  Financial Resource Strain: Not on file  Food Insecurity: No Food Insecurity (12/27/2023)   Epic    Worried About Programme Researcher, Broadcasting/film/video in the Last Year: Never true    Ran Out of Food in the Last Year: Never true  Transportation Needs: No Transportation Needs (12/27/2023)   Epic    Lack of Transportation (Medical): No    Lack of Transportation (Non-Medical): No  Physical Activity: Not on file  Stress: Not on file  Social Connections: Not on file  Depression (EYV7-0): Not on file  Alcohol Screen: Not on file  Housing: Low Risk (12/27/2023)   Epic    Unable to Pay for Housing in the Last Year: No    Number of Times Moved in the Last Year: 0    Homeless in the Last Year: No  Utilities: Not At Risk (12/27/2023)   Epic    Threatened with loss of utilities: No  Health Literacy: Not on file       Review of Systems: Denies fever, dyspnea, cough, or bleeding.  She does have mild headache, left lateral lower chest/left upper quadrant/epigastric discomfort to palpation, intermittent nausea  Vital Signs: BP 102/69 (BP Location: Left Arm)   Pulse 89   Temp 98.4 F (36.9 C) (Oral)   Resp 16   Ht 5' 8 (1.727 m)   Wt 165 lb 5.5 oz (75 kg)   LMP 12/09/2023 (Approximate)   SpO2 94%   BMI 25.14 kg/m   Advance Care Plan: No documents on file.  Physical Exam: Patient  awake, responds to questions.  Spouse in room.  Chest with slightly diminished breath sounds at bases.  Heart with regular rate and rhythm.  Abdomen soft, few bowel sounds, tender epigastric /left upper quadrant regions to palpation.  No lower extremity edema  Imaging: NM HEPATOBILIARY LEAK (POST-SURGICAL) Result Date: 12/27/2023 EXAM: NM HEPATOBILLARY SCAN 12/27/2023 04:18:21 PM TECHNIQUE: RADIOPHARMACEUTICAL: 5.4 mCi Tc-80m mebrofenin  Dynamic images of the abdomen and pelvis were obtained in the anterior projection for 1 hour after intravenous administration of radiopharmaceutical. Additional delayed images are obtained up to 4 hours. COMPARISON: CT abdomen and pelvis 12/27/2023. CLINICAL HISTORY: Abdominal pain, post surgery or trauma, bile leak suspected. FINDINGS: Homogenous uptake within the liver. Normal clearance of the blood pool. Gallbladder surgically absent. There is no radiotracer uptake within the gallbladder fossa. Activity is visualized in the small bowel. There is abnormal radiotracer uptake projecting over the left side of the liver likely corresponding to subcapsular fluid collection along the left lateral margin of the liver. IMPRESSION: 1. Abnormal  radiotracer activity along the left lateral hepatic margin corresponding to subcapsular fluid collection along the left lateral margin of the liver. Findings are worrisome for biloma/bile duct. Electronically signed by: Greig Pique MD 12/27/2023 06:26 PM EST RP Workstation: [id number]   CT ABDOMEN PELVIS W CONTRAST Result Date: 12/27/2023 CLINICAL DATA:  Abdominal abdominal pain 4 days after lap cholecystectomy. Nausea and vomiting. Inability to tolerate p.o. intake. EXAM: CT ABDOMEN AND PELVIS WITH CONTRAST TECHNIQUE: Multidetector CT imaging of the abdomen and pelvis was performed using the standard protocol following bolus administration of intravenous contrast. RADIATION DOSE REDUCTION: This exam was performed according to the  departmental dose-optimization program which includes automated exposure control, adjustment of the mA and/or kV according to patient size and/or use of iterative reconstruction technique. CONTRAST:  OMNIPAQUE  IOHEXOL  300 MG/ML  SOLN COMPARISON:  None Available. FINDINGS: Lower chest: Dependent atelectasis noted in the lung bases with trace left pleural effusion. Hepatobiliary: Subcapsular low-density fluid collection identified along the medial liver with a 6.2 x 4.6 cm oval component posterior to the lateral segment left liver tracking posterior to the medial segment left liver and into a somewhat bilobed component measuring 11.4 x 6.6 cm medial to the right liver extending into the gallbladder fossa. Surgical clips are noted in the gallbladder fossa. No intra or extrahepatic biliary duct dilatation. Pancreas: No focal mass lesion. No dilatation of the main duct. No intraparenchymal cyst. No peripancreatic edema. Spleen: No splenomegaly. No suspicious focal mass lesion. Trace perisplenic fluid evident. Adrenals/Urinary Tract: No adrenal nodule or mass. Kidneys unremarkable. No evidence for hydroureter. The urinary bladder appears normal for the degree of distention. Stomach/Bowel: Wall thickening is noted in the distal stomach with probable mural edema in the duodenum. No evidence for small bowel obstruction. The terminal ileum is normal. Nonvisualization of the appendix is consistent with the reported history of appendectomy. No gross colonic mass. No colonic wall thickening. Vascular/Lymphatic: No abdominal aortic aneurysm. No abdominal aortic atherosclerotic calcification. There is no gastrohepatic or hepatoduodenal ligament lymphadenopathy. No retroperitoneal or mesenteric lymphadenopathy. No pelvic sidewall lymphadenopathy. Reproductive: The uterus is unremarkable.  There is no adnexal mass. Other: Small volume free fluid in the pelvis. Musculoskeletal: No worrisome lytic or sclerotic osseous  abnormality. IMPRESSION: 1. Subcapsular low-density fluid collection along posterior left liver and medial right liver extending into the gallbladder fossa. Imaging features are nonspecific but given the history of recent cholecystectomy, bile leak is a distinct concern. Given low-density of the fluid, hematoma is considered less likely. Given the size and mass-effect on the liver parenchyma, seroma also considered less likely. Nuclear scintigraphy may prove helpful to further evaluate. 2. Wall thickening in the distal stomach with probable mural edema in the duodenum. Findings are felt to be secondary. 3. Small volume free fluid around the spleen and in the pelvis. 4. Trace left pleural effusion with dependent atelectasis in the lung bases. Electronically Signed   By: Camellia Candle M.D.   On: 12/27/2023 12:49   US  Abdomen Limited RUQ (LIVER/GB) Addendum Date: 12/23/2023 ADDENDUM REPORT: 12/23/2023 12:28 ADDENDUM: Results were discussed with Dr. Lamar Gander at 12:25 p.m. Eastern on December 23, 2023. Electronically Signed   By: Suzen Dials M.D.   On: 12/23/2023 12:28   Result Date: 12/23/2023 CLINICAL DATA:  Right upper quadrant pain x5 days. EXAM: ULTRASOUND ABDOMEN LIMITED RIGHT UPPER QUADRANT COMPARISON:  None Available. FINDINGS: Gallbladder: Numerous small shadowing, echogenic gallstones are seen within the dependent portion of a moderate to markedly distended gallbladder. There is  gallbladder wall thickening (4.1 mm). A positive sonographic Beverley sign is noted by sonographer. Common bile duct: Diameter: 5.0 mm Liver: No focal lesion identified. Within normal limits in parenchymal echogenicity. Portal vein is patent on color Doppler imaging with normal direction of blood flow towards the liver. Other: None. IMPRESSION: Cholelithiasis with additional findings consistent with acute cholecystitis. Electronically Signed: By: Suzen Dials M.D. On: 12/23/2023 12:19    Labs:  CBC: Recent  Labs    12/22/23 1433 12/23/23 1219 12/27/23 1021 12/28/23 0415  WBC 7.5 7.0 7.6 6.2  HGB 13.8 13.6 13.8 11.2*  HCT 41.9 41.4 41.3 33.9*  PLT 254 222 313 251    COAGS: Recent Labs    12/28/23 0415  INR 1.1    BMP: Recent Labs    12/22/23 1433 12/23/23 1219 12/27/23 1021 12/28/23 0415  NA 136 136 135 134*  K 3.7 3.8 3.3* 3.2*  CL 104 102 99 101  CO2 21* 24 25 24   GLUCOSE 99 100* 120* 170*  BUN 8 11 13 10   CALCIUM 9.2 9.2 9.7 8.6*  CREATININE 0.69 0.63 0.56 0.57  GFRNONAA >60 >60 >60 >60    LIVER FUNCTION TESTS: Recent Labs    12/22/23 1433 12/23/23 1219 12/27/23 1021 12/28/23 0415  BILITOT 0.6 0.5 1.3* 0.8  AST 16 14* 39 27  ALT 11 7 43 27  ALKPHOS 59 67 349* 283*  PROT 7.7 7.9 8.3* 6.7  ALBUMIN 3.3* 4.0 3.5 2.9*    TUMOR MARKERS: No results for input(s): AFPTM, CEA, CA199, CHROMGRNA in the last 8760 hours.  Assessment and Plan: 43 y.o. female with past medical history significant for neck/back pain with prior cervical decompression/discectomy/fusion in 2023 who recently underwent laparoscopic cholecystectomy for cholecystitis/ obstructing cholelithiasis on 12/23/2023.  She now presents with persistent abdominal pain, nausea and CT scan revealing:  1. Subcapsular low-density fluid collection along posterior left liver and medial right liver extending into the gallbladder fossa. Imaging features are nonspecific but given the history of recent cholecystectomy, bile leak is a distinct concern. Given low-density of the fluid, hematoma is considered less likely. Given the size and mass-effect on the liver parenchyma, seroma also considered less likely. Nuclear scintigraphy may prove helpful to further evaluate. 2. Wall thickening in the distal stomach with probable mural edema in the duodenum. Findings are felt to be secondary. 3. Small volume free fluid around the spleen and in the pelvis. 4. Trace left pleural effusion with dependent atelectasis  in the lung bases.   HIDA scan revealed:  1. Abnormal radiotracer activity along the left lateral hepatic margin corresponding to subcapsular fluid collection along the left lateral margin of the liver. Findings are worrisome for biloma/bile duct.   She is currently afebrile, on IV Zosyn , WBC normal, hemoglobin 11.2, platelets normal, PT 15.3, INR 1.1, creatinine normal, potassium 3.2, total bilirubin 0.8, blood culture negative to date; request now received from surgical team for image guided drainage of perihepatic/gallbladder fossa fluid collection.  Imaging studies have been reviewed by Dr.Henn. Risks and benefits discussed with the patient /spouse via interpreter including bleeding, infection, damage to adjacent structures, bowel perforation/fistula connection, and sepsis.  All of the patient's questions were answered, patient is agreeable to proceed. Consent signed and in chart.  Procedure scheduled for today  Thank you for allowing our service to participate in Elizabeth Brennan 's care.  Electronically Signed: D. Franky Rakers, PA-C   12/28/2023, 10:52 AM      I spent a total of  40 minutes   in face to face in clinical consultation, greater than 50% of which was counseling/coordinating care for image guided drainage of perihepatic/gallbladder fossa fluid collection

## 2023-12-29 LAB — COMPREHENSIVE METABOLIC PANEL WITH GFR
ALT: 26 U/L (ref 0–44)
AST: 23 U/L (ref 15–41)
Albumin: 2.7 g/dL — ABNORMAL LOW (ref 3.5–5.0)
Alkaline Phosphatase: 284 U/L — ABNORMAL HIGH (ref 38–126)
Anion gap: 8 (ref 5–15)
BUN: 8 mg/dL (ref 6–20)
CO2: 24 mmol/L (ref 22–32)
Calcium: 8.2 mg/dL — ABNORMAL LOW (ref 8.9–10.3)
Chloride: 103 mmol/L (ref 98–111)
Creatinine, Ser: 0.47 mg/dL (ref 0.44–1.00)
GFR, Estimated: >60 mL/min (ref >60)
Glucose, Bld: 143 mg/dL — ABNORMAL HIGH (ref 70–99)
Potassium: 3.7 mmol/L (ref 3.5–5.1)
Sodium: 134 mmol/L — ABNORMAL LOW (ref 135–145)
Total Bilirubin: 0.8 mg/dL (ref 0.0–1.2)
Total Protein: 6.3 g/dL — ABNORMAL LOW (ref 6.5–8.1)

## 2023-12-29 LAB — CBC
HCT: 32.8 % — ABNORMAL LOW (ref 36.0–46.0)
Hemoglobin: 10.8 g/dL — ABNORMAL LOW (ref 12.0–15.0)
MCH: 28.1 pg (ref 26.0–34.0)
MCHC: 32.9 g/dL (ref 30.0–36.0)
MCV: 85.2 fL (ref 80.0–100.0)
Platelets: 266 K/uL (ref 150–400)
RBC: 3.85 MIL/uL — ABNORMAL LOW (ref 3.87–5.11)
RDW: 12.9 % (ref 11.5–15.5)
WBC: 5 K/uL (ref 4.0–10.5)
nRBC: 0 % (ref 0.0–0.2)

## 2023-12-29 NOTE — Progress Notes (Signed)
 Subjective: Arabic interpreter used.  Patient states her pain is improving today.  Tolerated clear liquids yesterday.    Objective: Vital signs in last 24 hours: Temp:  [98.1 F (36.7 C)-98.4 F (36.9 C)] 98.1 F (36.7 C) (12/16 0655) Pulse Rate:  [86-119] 86 (12/16 0655) Resp:  [15-25] 18 (12/16 0655) BP: (94-114)/(65-80) 102/71 (12/16 0655) SpO2:  [94 %-100 %] 98 % (12/16 0655)    Intake/Output from previous day: 12/15 0701 - 12/16 0700 In: 2298.7 [P.O.:250; I.V.:1934.9; IV Piggyback:113.9] Out: 230 [Drains:230] Intake/Output this shift: No intake/output data recorded.  PE: Gen:  Alert, NAD, pleasant Card:  Reg Pulm:  Rate and effort normal Abd: Soft, NT, incisions c/d/I, JP IR drain in place in her RUQ with 50cc of bilious output which was emptied and recharged.  Total of 230cc since placement yesterday  Lab Results:  Recent Labs    12/28/23 0415 12/29/23 0503  WBC 6.2 5.0  HGB 11.2* 10.8*  HCT 33.9* 32.8*  PLT 251 266   BMET Recent Labs    12/28/23 0415 12/29/23 0503  NA 134* 134*  K 3.2* 3.7  CL 101 103  CO2 24 24  GLUCOSE 170* 143*  BUN 10 8  CREATININE 0.57 0.47  CALCIUM 8.6* 8.2*   PT/INR Recent Labs    12/28/23 0415  LABPROT 15.3*  INR 1.1   CMP     Component Value Date/Time   NA 134 (L) 12/29/2023 0503   K 3.7 12/29/2023 0503   CL 103 12/29/2023 0503   CO2 24 12/29/2023 0503   GLUCOSE 143 (H) 12/29/2023 0503   BUN 8 12/29/2023 0503   CREATININE 0.47 12/29/2023 0503   CALCIUM 8.2 (L) 12/29/2023 0503   PROT 6.3 (L) 12/29/2023 0503   ALBUMIN 2.7 (L) 12/29/2023 0503   AST 23 12/29/2023 0503   ALT 26 12/29/2023 0503   ALKPHOS 284 (H) 12/29/2023 0503   BILITOT 0.8 12/29/2023 0503   GFRNONAA >60 12/29/2023 0503   GFRAA >60 11/15/2016 1327   Lipase     Component Value Date/Time   LIPASE 13 12/27/2023 1021    Studies/Results: CT GUIDED PERITONEAL/RETROPERITONEAL FLUID DRAIN BY PERC CATH Result Date:  12/28/2023 INDICATION: 43 year old with a bile leak following cholecystectomy. Patient has a fluid collection in the right upper quadrant. EXAM: CT-guided placement of drainage catheter in right upper quadrant fluid collection TECHNIQUE: Multidetector CT imaging of the abdomen was performed following the standard protocol without IV contrast. RADIATION DOSE REDUCTION: This exam was performed according to the departmental dose-optimization program which includes automated exposure control, adjustment of the mA and/or kV according to patient size and/or use of iterative reconstruction technique. MEDICATIONS: Moderate sedation ANESTHESIA/SEDATION: Moderate (conscious) sedation was employed during this procedure. A total of Versed  4 mg and Fentanyl  200 mcg and Dilaudid  1 mg was administered intravenously by the radiology nurse. Total intra-service moderate Sedation Time: 49 minutes. The patient's level of consciousness and vital signs were monitored continuously by radiology nursing throughout the procedure under my direct supervision. COMPLICATIONS: None immediate. PROCEDURE: Informed written consent was obtained from the patient after a thorough discussion of the procedural risks, benefits and alternatives. All questions were addressed using an interpreter. Maximal Sterile Barrier Technique was utilized including caps, mask, sterile gowns, sterile gloves, sterile drape, hand hygiene and skin antiseptic. A timeout was performed prior to the initiation of the procedure. Patient was initially placed supine and CT images abdomen were obtained. Patient was then rolled onto her  left side and additional CT images were obtained. The right flank was prepped with chlorhexidine  and sterile field was created. Skin was anesthetized using 1% lidocaine . Initially, an 18 gauge needle was directed towards the right upper quadrant fluid collection using a transhepatic approach. This needle was retracted and the needle was directed  into the fluid collection between the inferior right hepatic lobe and the right kidney. Bilious fluid was aspirated from the needle. Superstiff Amplatz wire was advanced into the collection. Follow up CT images were obtained following wire placement. Tract was dilated to accommodate 10 French multipurpose drain. Additional bilious fluid was aspirated. Drain was sutured to skin and attached to a suction bulb. Fluid sample sent for culture. FINDINGS: There was a large amount of fluid along the medial aspect of the right hepatic lobe and posterior to the left hepatic lobe. Needle was placed between the inferior right hepatic lobe and the right kidney. Following placement of the wire, the fluid collection along the medial aspect of the liver appeared to decompress and move into the left abdomen. Pigtail portion of the drain reconstituted posterior to the left hepatic lobe. Bilious output from the drain. IMPRESSION: CT-guided placement of a drainage catheter in the perihepatic fluid collection. Drain was placed between the right kidney and the inferior aspect of the right hepatic lobe. Electronically Signed   By: Juliene Balder M.D.   On: 12/28/2023 16:44   NM HEPATOBILIARY LEAK (POST-SURGICAL) Result Date: 12/27/2023 EXAM: NM HEPATOBILLARY SCAN 12/27/2023 04:18:21 PM TECHNIQUE: RADIOPHARMACEUTICAL: 5.4 mCi Tc-52m mebrofenin  Dynamic images of the abdomen and pelvis were obtained in the anterior projection for 1 hour after intravenous administration of radiopharmaceutical. Additional delayed images are obtained up to 4 hours. COMPARISON: CT abdomen and pelvis 12/27/2023. CLINICAL HISTORY: Abdominal pain, post surgery or trauma, bile leak suspected. FINDINGS: Homogenous uptake within the liver. Normal clearance of the blood pool. Gallbladder surgically absent. There is no radiotracer uptake within the gallbladder fossa. Activity is visualized in the small bowel. There is abnormal radiotracer uptake projecting over the left  side of the liver likely corresponding to subcapsular fluid collection along the left lateral margin of the liver. IMPRESSION: 1. Abnormal radiotracer activity along the left lateral hepatic margin corresponding to subcapsular fluid collection along the left lateral margin of the liver. Findings are worrisome for biloma/bile duct. Electronically signed by: Greig Pique MD 12/27/2023 06:26 PM EST RP Workstation: [id number]   CT ABDOMEN PELVIS W CONTRAST Result Date: 12/27/2023 CLINICAL DATA:  Abdominal abdominal pain 4 days after lap cholecystectomy. Nausea and vomiting. Inability to tolerate p.o. intake. EXAM: CT ABDOMEN AND PELVIS WITH CONTRAST TECHNIQUE: Multidetector CT imaging of the abdomen and pelvis was performed using the standard protocol following bolus administration of intravenous contrast. RADIATION DOSE REDUCTION: This exam was performed according to the departmental dose-optimization program which includes automated exposure control, adjustment of the mA and/or kV according to patient size and/or use of iterative reconstruction technique. CONTRAST:  OMNIPAQUE  IOHEXOL  300 MG/ML  SOLN COMPARISON:  None Available. FINDINGS: Lower chest: Dependent atelectasis noted in the lung bases with trace left pleural effusion. Hepatobiliary: Subcapsular low-density fluid collection identified along the medial liver with a 6.2 x 4.6 cm oval component posterior to the lateral segment left liver tracking posterior to the medial segment left liver and into a somewhat bilobed component measuring 11.4 x 6.6 cm medial to the right liver extending into the gallbladder fossa. Surgical clips are noted in the gallbladder fossa. No intra or extrahepatic biliary duct  dilatation. Pancreas: No focal mass lesion. No dilatation of the main duct. No intraparenchymal cyst. No peripancreatic edema. Spleen: No splenomegaly. No suspicious focal mass lesion. Trace perisplenic fluid evident. Adrenals/Urinary Tract: No adrenal  nodule or mass. Kidneys unremarkable. No evidence for hydroureter. The urinary bladder appears normal for the degree of distention. Stomach/Bowel: Wall thickening is noted in the distal stomach with probable mural edema in the duodenum. No evidence for small bowel obstruction. The terminal ileum is normal. Nonvisualization of the appendix is consistent with the reported history of appendectomy. No gross colonic mass. No colonic wall thickening. Vascular/Lymphatic: No abdominal aortic aneurysm. No abdominal aortic atherosclerotic calcification. There is no gastrohepatic or hepatoduodenal ligament lymphadenopathy. No retroperitoneal or mesenteric lymphadenopathy. No pelvic sidewall lymphadenopathy. Reproductive: The uterus is unremarkable.  There is no adnexal mass. Other: Small volume free fluid in the pelvis. Musculoskeletal: No worrisome lytic or sclerotic osseous abnormality. IMPRESSION: 1. Subcapsular low-density fluid collection along posterior left liver and medial right liver extending into the gallbladder fossa. Imaging features are nonspecific but given the history of recent cholecystectomy, bile leak is a distinct concern. Given low-density of the fluid, hematoma is considered less likely. Given the size and mass-effect on the liver parenchyma, seroma also considered less likely. Nuclear scintigraphy may prove helpful to further evaluate. 2. Wall thickening in the distal stomach with probable mural edema in the duodenum. Findings are felt to be secondary. 3. Small volume free fluid around the spleen and in the pelvis. 4. Trace left pleural effusion with dependent atelectasis in the lung bases. Electronically Signed   By: Camellia Candle M.D.   On: 12/27/2023 12:49    Anti-infectives: Anti-infectives (From admission, onward)    Start     Dose/Rate Route Frequency Ordered Stop   12/27/23 2200  piperacillin -tazobactam (ZOSYN ) IVPB 3.375 g        3.375 g 12.5 mL/hr over 240 Minutes Intravenous Every 8  hours 12/27/23 2044 01/03/24 2359   12/27/23 1300  piperacillin -tazobactam (ZOSYN ) IVPB 3.375 g        3.375 g 100 mL/hr over 30 Minutes Intravenous  Once 12/27/23 1253 12/27/23 1350        Assessment/Plan POD 6, s/p laparoscopic cholecystectomy by Dr. Debby on 12/10 now with bile leak -s/p IR drain on 12/15 for bile leak -cont to monitor output quantity.  If this remains <300cc/24hrs then conservative management as this will likely resolve without intervention.  If >300cc/24hrs then will need GI eval for possible stent placement -labs reviewed and overall stable.  FEN - regular diet, may have food from home VTE - SCDs, fondaparinux   ID - Zosyn , now controlled, likely doesn't need any or much more of this. Plan - monitor drain output   LOS: 2 days    Burnard FORBES Banter, [facility] Surgery 12/29/2023, 8:50 AM Please see Amion for pager number during day hours 7:00am-4:30pm

## 2023-12-29 NOTE — Progress Notes (Signed)
 Referring Physician(s): Byerly,F  Supervising Physician: Jennefer Rover  Patient Status:  [facility] - In-pt  Chief Complaint: Abdominal pain, nausea, perihepatic fluid collections consistent with bile leak post cholecystectomy; s/p perihepatic fluid collection drain on 12/15   Subjective: Patient states that she feels better since abdominal drain placed yesterday but still has some soreness in right upper quadrant.  No fever or chills.   Allergies: Porcine (pork) protein-containing drug products  Medications: Prior to Admission medications  Medication Sig Start Date End Date Taking? Authorizing Provider  acetaminophen  (TYLENOL ) 500 MG tablet Take 2 tablets (1,000 mg total) by mouth every 6 (six) hours as needed for mild pain (pain score 1-3). 12/23/23 12/22/24  Augustus Almarie RAMAN, PA-C  ibuprofen  (ADVIL ) 200 MG tablet Take 200 mg by mouth every 6 (six) hours as needed for moderate pain. Pt states she takes up to 1,000mg  over the course of the day.    [provider]  methocarbamol  (ROBAXIN ) 500 MG tablet Take 1 tablet (500 mg total) by mouth 2 (two) times daily. 12/19/23   Leatrice Vernell HERO, NP  omeprazole  (PRILOSEC) 40 MG capsule Take 1 capsule (40 mg total) by mouth daily. 12/19/23   Leatrice Vernell HERO, NP  oxyCODONE  (OXY IR/ROXICODONE ) 5 MG immediate release tablet Take 1 tablet (5 mg total) by mouth every 6 (six) hours as needed for severe pain (pain score 7-10). 12/23/23   Augustus Almarie RAMAN, PA-C     Vital Signs: BP 102/71 (BP Location: Left Arm)   Pulse 86   Temp 98.1 F (36.7 C) (Oral)   Resp 18   Ht 5' 8 (1.727 m)   Wt 165 lb 5.5 oz (75 kg)   LMP 12/09/2023 (Approximate)   SpO2 98%   BMI 25.14 kg/m   Physical Exam awake, alert.  Right upper quadrant drain intact, insertion site mildly tender to palpation, output yesterday 230 cc, today 100+ cc bilious fluid  Imaging: CT GUIDED PERITONEAL/RETROPERITONEAL FLUID DRAIN BY PERC CATH Result Date:  12/28/2023 INDICATION: 43 year old with a bile leak following cholecystectomy. Patient has a fluid collection in the right upper quadrant. EXAM: CT-guided placement of drainage catheter in right upper quadrant fluid collection TECHNIQUE: Multidetector CT imaging of the abdomen was performed following the standard protocol without IV contrast. RADIATION DOSE REDUCTION: This exam was performed according to the departmental dose-optimization program which includes automated exposure control, adjustment of the mA and/or kV according to patient size and/or use of iterative reconstruction technique. MEDICATIONS: Moderate sedation ANESTHESIA/SEDATION: Moderate (conscious) sedation was employed during this procedure. A total of Versed  4 mg and Fentanyl  200 mcg and Dilaudid  1 mg was administered intravenously by the radiology nurse. Total intra-service moderate Sedation Time: 49 minutes. The patient's level of consciousness and vital signs were monitored continuously by radiology nursing throughout the procedure under my direct supervision. COMPLICATIONS: None immediate. PROCEDURE: Informed written consent was obtained from the patient after a thorough discussion of the procedural risks, benefits and alternatives. All questions were addressed using an interpreter. Maximal Sterile Barrier Technique was utilized including caps, mask, sterile gowns, sterile gloves, sterile drape, hand hygiene and skin antiseptic. A timeout was performed prior to the initiation of the procedure. Patient was initially placed supine and CT images abdomen were obtained. Patient was then rolled onto her left side and additional CT images were obtained. The right flank was prepped with chlorhexidine  and sterile field was created. Skin was anesthetized using 1% lidocaine . Initially, an 18 gauge needle was directed towards the right  upper quadrant fluid collection using a transhepatic approach. This needle was retracted and the needle was directed  into the fluid collection between the inferior right hepatic lobe and the right kidney. Bilious fluid was aspirated from the needle. Superstiff Amplatz wire was advanced into the collection. Follow up CT images were obtained following wire placement. Tract was dilated to accommodate 10 French multipurpose drain. Additional bilious fluid was aspirated. Drain was sutured to skin and attached to a suction bulb. Fluid sample sent for culture. FINDINGS: There was a large amount of fluid along the medial aspect of the right hepatic lobe and posterior to the left hepatic lobe. Needle was placed between the inferior right hepatic lobe and the right kidney. Following placement of the wire, the fluid collection along the medial aspect of the liver appeared to decompress and move into the left abdomen. Pigtail portion of the drain reconstituted posterior to the left hepatic lobe. Bilious output from the drain. IMPRESSION: CT-guided placement of a drainage catheter in the perihepatic fluid collection. Drain was placed between the right kidney and the inferior aspect of the right hepatic lobe. Electronically Signed   By: Juliene Balder M.D.   On: 12/28/2023 16:44   NM HEPATOBILIARY LEAK (POST-SURGICAL) Result Date: 12/27/2023 EXAM: NM HEPATOBILLARY SCAN 12/27/2023 04:18:21 PM TECHNIQUE: RADIOPHARMACEUTICAL: 5.4 mCi Tc-50m mebrofenin  Dynamic images of the abdomen and pelvis were obtained in the anterior projection for 1 hour after intravenous administration of radiopharmaceutical. Additional delayed images are obtained up to 4 hours. COMPARISON: CT abdomen and pelvis 12/27/2023. CLINICAL HISTORY: Abdominal pain, post surgery or trauma, bile leak suspected. FINDINGS: Homogenous uptake within the liver. Normal clearance of the blood pool. Gallbladder surgically absent. There is no radiotracer uptake within the gallbladder fossa. Activity is visualized in the small bowel. There is abnormal radiotracer uptake projecting over the left  side of the liver likely corresponding to subcapsular fluid collection along the left lateral margin of the liver. IMPRESSION: 1. Abnormal radiotracer activity along the left lateral hepatic margin corresponding to subcapsular fluid collection along the left lateral margin of the liver. Findings are worrisome for biloma/bile duct. Electronically signed by: Greig Pique MD 12/27/2023 06:26 PM EST RP Workstation: [id number]   CT ABDOMEN PELVIS W CONTRAST Result Date: 12/27/2023 CLINICAL DATA:  Abdominal abdominal pain 4 days after lap cholecystectomy. Nausea and vomiting. Inability to tolerate p.o. intake. EXAM: CT ABDOMEN AND PELVIS WITH CONTRAST TECHNIQUE: Multidetector CT imaging of the abdomen and pelvis was performed using the standard protocol following bolus administration of intravenous contrast. RADIATION DOSE REDUCTION: This exam was performed according to the departmental dose-optimization program which includes automated exposure control, adjustment of the mA and/or kV according to patient size and/or use of iterative reconstruction technique. CONTRAST:  OMNIPAQUE  IOHEXOL  300 MG/ML  SOLN COMPARISON:  None Available. FINDINGS: Lower chest: Dependent atelectasis noted in the lung bases with trace left pleural effusion. Hepatobiliary: Subcapsular low-density fluid collection identified along the medial liver with a 6.2 x 4.6 cm oval component posterior to the lateral segment left liver tracking posterior to the medial segment left liver and into a somewhat bilobed component measuring 11.4 x 6.6 cm medial to the right liver extending into the gallbladder fossa. Surgical clips are noted in the gallbladder fossa. No intra or extrahepatic biliary duct dilatation. Pancreas: No focal mass lesion. No dilatation of the main duct. No intraparenchymal cyst. No peripancreatic edema. Spleen: No splenomegaly. No suspicious focal mass lesion. Trace perisplenic fluid evident. Adrenals/Urinary Tract: No adrenal  nodule  or mass. Kidneys unremarkable. No evidence for hydroureter. The urinary bladder appears normal for the degree of distention. Stomach/Bowel: Wall thickening is noted in the distal stomach with probable mural edema in the duodenum. No evidence for small bowel obstruction. The terminal ileum is normal. Nonvisualization of the appendix is consistent with the reported history of appendectomy. No gross colonic mass. No colonic wall thickening. Vascular/Lymphatic: No abdominal aortic aneurysm. No abdominal aortic atherosclerotic calcification. There is no gastrohepatic or hepatoduodenal ligament lymphadenopathy. No retroperitoneal or mesenteric lymphadenopathy. No pelvic sidewall lymphadenopathy. Reproductive: The uterus is unremarkable.  There is no adnexal mass. Other: Small volume free fluid in the pelvis. Musculoskeletal: No worrisome lytic or sclerotic osseous abnormality. IMPRESSION: 1. Subcapsular low-density fluid collection along posterior left liver and medial right liver extending into the gallbladder fossa. Imaging features are nonspecific but given the history of recent cholecystectomy, bile leak is a distinct concern. Given low-density of the fluid, hematoma is considered less likely. Given the size and mass-effect on the liver parenchyma, seroma also considered less likely. Nuclear scintigraphy may prove helpful to further evaluate. 2. Wall thickening in the distal stomach with probable mural edema in the duodenum. Findings are felt to be secondary. 3. Small volume free fluid around the spleen and in the pelvis. 4. Trace left pleural effusion with dependent atelectasis in the lung bases. Electronically Signed   By: Camellia Candle M.D.   On: 12/27/2023 12:49    Labs:  CBC: Recent Labs    12/23/23 1219 12/27/23 1021 12/28/23 0415 12/29/23 0503  WBC 7.0 7.6 6.2 5.0  HGB 13.6 13.8 11.2* 10.8*  HCT 41.4 41.3 33.9* 32.8*  PLT 222 313 251 266    COAGS: Recent Labs    12/28/23 0415  INR  1.1    BMP: Recent Labs    12/23/23 1219 12/27/23 1021 12/28/23 0415 12/29/23 0503  NA 136 135 134* 134*  K 3.8 3.3* 3.2* 3.7  CL 102 99 101 103  CO2 24 25 24 24   GLUCOSE 100* 120* 170* 143*  BUN 11 13 10 8   CALCIUM 9.2 9.7 8.6* 8.2*  CREATININE 0.63 0.56 0.57 0.47  GFRNONAA >60 >60 >60 >60    LIVER FUNCTION TESTS: Recent Labs    12/23/23 1219 12/27/23 1021 12/28/23 0415 12/29/23 0503  BILITOT 0.5 1.3* 0.8 0.8  AST 14* 39 27 23  ALT 7 43 27 26  ALKPHOS 67 349* 283* 284*  PROT 7.9 8.3* 6.7 6.3*  ALBUMIN 4.0 3.5 2.9* 2.7*    Assessment and Plan: 43 y.o. female with past medical history significant for neck/back pain with prior cervical decompression/discectomy/fusion in 2023 who recently underwent laparoscopic cholecystectomy for cholecystitis/ obstructing cholelithiasis on 12/23/2023 ( 10 fr to JP); now with postop perihepatic fluid collection consistent with bile leak.  Status post drain placement on 12/15; afebrile, WBC normal, hemoglobin stable, creatinine normal, total bilirubin normal, drain fluid culture with no growth to date; continue current treatment, close output monitoring; other plans as per [facility]; obtain follow-up CT scan once drain output minimal   Electronically Signed: D. Franky Rakers, PA-C 12/29/2023, 1:40 PM   I spent a total of 15 Minutes at the the patient's bedside AND on the patient's hospital floor or unit, greater than 50% of which was counseling/coordinating care for perihepatic fluid collection drain   Patient ID: Elizabeth Brennan, female   DOB: [birth year], 43 y.o.   MRN: [id number]

## 2023-12-29 NOTE — Plan of Care (Signed)
   Problem: Clinical Measurements: Goal: Will remain free from infection Outcome: Progressing Goal: Diagnostic test results will improve Outcome: Progressing

## 2023-12-29 NOTE — Plan of Care (Signed)
   Problem: Activity: Goal: Risk for activity intolerance will decrease Outcome: Progressing   Problem: Nutrition: Goal: Adequate nutrition will be maintained Outcome: Progressing   Problem: Pain Managment: Goal: General experience of comfort will improve and/or be controlled Outcome: Progressing

## 2023-12-30 ENCOUNTER — Other Ambulatory Visit (HOSPITAL_COMMUNITY): Payer: Self-pay

## 2023-12-30 MED ORDER — ALUM & MAG HYDROXIDE-SIMETH 200-200-20 MG/5ML PO SUSP
30.0000 mL | Freq: Four times a day (QID) | ORAL | Status: DC | PRN
  Administered 2023-12-30: 09:00:00 30 mL via ORAL
  Filled 2023-12-30: qty 30

## 2023-12-30 MED ORDER — OXYCODONE HCL 5 MG PO TABS
5.0000 mg | ORAL_TABLET | Freq: Four times a day (QID) | ORAL | 0 refills | Status: AC | PRN
  Filled 2023-12-30: qty 7, 2d supply, fill #0

## 2023-12-30 NOTE — Plan of Care (Signed)
   Problem: Clinical Measurements: Goal: Ability to maintain clinical measurements within normal limits will improve Outcome: Progressing Goal: Will remain free from infection Outcome: Progressing

## 2023-12-30 NOTE — Discharge Summary (Signed)
 Patient ID: Elizabeth Brennan [id number] 02/20/1980 43 y.o.  Admit date: 12/27/2023 Discharge date: 12/30/2023  Admitting Diagnosis: S/p lap chole with intra-abdominal fluid collection  Discharge Diagnosis Patient Active Problem List   Diagnosis Date Noted   Bile leak, postoperative 12/27/2023   Cervical spondylosis with myelopathy and radiculopathy 11/08/2021   Post-dates pregnancy 02/02/2018    Consultants IR  Reason for Admission: Patient presents to the ED postop day 4 following laparoscopic cholecystectomy. She underwent lap chole on December 10 by Dr. Debby. She reports that she never really felt great after surgery. She started feeling worse yesterday and today. Today she felt like her abdomen was distended and that she felt a bit short of breath. She has has been having some nausea and vomiting. She denies diarrhea, she has been having sweats. She is still passing gas. She does not think that her urine has significantly changed color.   Procedures Percutaneous drain placement by IR on 12/28/23  Hospital Course:  The patient was admitted and started on IV abx and a HIDA scan was ordered.  This was positive for a bile leak.  IR was then consulted for a drain placement which occurred on 12/15.  Her pain resolved after this.  Her diet was advanced and she was tolerating this well.  Her output remained under 300cc/day.  She was initially 230cc and then down to 190cc.  No GI intervention warranted at this time.  She was otherwise stable at this time for DC home with IR and surgical follow up.  Instructions provided for patient and her husband via Arabic interpreter.  All questions were answered.  No further abx were warranted at discharge given control of her biloma with her drain.  Physical Exam: Gen: NAD Heart: regular Lungs: CTAB, pulls about 500cc on IS Abd: soft, NT, JP with minimal bilious output today, incisions c/d/i  Allergies as of 12/30/2023       Reactions   Porcine  (pork) Protein-containing Drug Products Other (See Comments)   Cultural value         Medication List     TAKE these medications    acetaminophen  500 MG tablet Commonly known as: TYLENOL  Take 2 tablets (1,000 mg total) by mouth every 6 (six) hours as needed for mild pain (pain score 1-3).   ibuprofen  200 MG tablet Commonly known as: ADVIL  Take 200 mg by mouth every 6 (six) hours as needed for moderate pain. Pt states she takes up to 1,000mg  over the course of the day.   methocarbamol  500 MG tablet Commonly known as: ROBAXIN  Take 1 tablet (500 mg total) by mouth 2 (two) times daily.   omeprazole  40 MG capsule Commonly known as: PRILOSEC Take 1 capsule (40 mg total) by mouth daily.   oxyCODONE  5 MG immediate release tablet Commonly known as: Oxy IR/ROXICODONE  Take 1 tablet (5 mg total) by mouth every 6 (six) hours as needed for severe pain (pain score 7-10).          Follow-up Information     Debby Hila, MD Follow up on 01/19/2024.   Specialties: General Surgery, Colon and Rectal Surgery Why: 10:10am, Arrive 30 minutes prior to your appointment time, Please bring your insurance card and photo ID Contact information: [address] [place] [address] [phone]         Philip Cornet, MD Follow up in 10 day(s).   Specialties: Interventional Radiology, Radiology Why: Their office will call you to set up  an appointment to have your drain checked. Contact information: [address], [address] [place] [address] [phone]                 Signed: Burnard Banter, [facility] 12/30/2023, 9:39 AM Please see Amion for pager number during day hours 7:00am-4:30pm, 7-11:30am on Weekends

## 2023-12-30 NOTE — Discharge Instructions (Signed)

## 2023-12-30 NOTE — Progress Notes (Signed)
 Patient and spouse was given discharge instructions with the interpretor, and all questions were answered. Patient and spouse were also given education on the JP drain and to cover while showering. Patient was taken to the main exit by wheelchair.

## 2024-01-01 LAB — CULTURE, BLOOD (ROUTINE X 2)
Culture: NO GROWTH
Culture: NO GROWTH
Special Requests: ADEQUATE
Special Requests: ADEQUATE

## 2024-01-02 LAB — AEROBIC/ANAEROBIC CULTURE W GRAM STAIN (SURGICAL/DEEP WOUND)
Culture: NO GROWTH
Gram Stain: NONE SEEN

## 2024-01-05 ENCOUNTER — Other Ambulatory Visit (HOSPITAL_COMMUNITY): Payer: Self-pay | Admitting: Radiology

## 2024-01-05 ENCOUNTER — Other Ambulatory Visit: Payer: Self-pay | Admitting: General Surgery

## 2024-01-05 DIAGNOSIS — K838 Other specified diseases of biliary tract: Secondary | ICD-10-CM

## 2024-01-12 ENCOUNTER — Ambulatory Visit
Admission: RE | Admit: 2024-01-12 | Discharge: 2024-01-12 | Disposition: A | Discharge status: Home / Self Care | Payer: Self-pay | Source: Ambulatory Visit | Attending: Radiology | Admitting: Radiology

## 2024-01-12 ENCOUNTER — Other Ambulatory Visit: Payer: Self-pay | Admitting: General Surgery

## 2024-01-12 ENCOUNTER — Ambulatory Visit
Admission: RE | Admit: 2024-01-12 | Discharge: 2024-01-12 | Disposition: A | Discharge status: Home / Self Care | Payer: Self-pay | Source: Ambulatory Visit | Attending: General Surgery | Admitting: General Surgery

## 2024-01-12 DIAGNOSIS — K838 Other specified diseases of biliary tract: Secondary | ICD-10-CM

## 2024-01-12 HISTORY — PX: IR RADIOLOGIST EVAL & MGMT: IMG5224

## 2024-01-12 MED ORDER — IOPAMIDOL (ISOVUE-300) INJECTION 61%
100.0000 mL | Freq: Once | INTRAVENOUS | Status: AC | PRN
  Administered 2024-01-12: 100 mL via INTRAVENOUS

## 2024-01-12 NOTE — Progress Notes (Signed)
 "   Chief Complaint: The patient is seen in follow up today s/p RUQ drain placement for perihepatic fluid collection concerning for bile leak post cholecystectomy  Referring Physician(s): Dr. JULIANNA Nephew  Supervising Physician: Karalee Beat  History of present illness:  Elizabeth Brennan is a 43 year old female with a recent history of acute cholecystitis s/p laparoscopic cholecystectomy on 12/10 with Dr. DELENA Ned who presented to the [facility] ED on 12/14 with concerns for progressively worsening abdominal pain and associated nausea for the 4 days since her surgery. CT imaging significant for subcapsular low-density fluid collection along posterior left-liver and medial right liver extending into the gallbladder fossa and Hepatobiliary scan worrisome for biloma/bile duct leak. IR was subsequently consulted and patient underwent RUQ drain placement with Dr. DELENA Balder on 12/15. Drain culture had no growth and her hospital course was uncomplicated with patient reporting quick symptomatic improvement following drain placement. She was discharged home with no antibiotics on 12/17 with outpatient Surgery and IR follow up.   She presents to IR outpatient clinic today for her RUQ drain follow up. She reports doing well since her discharge and denies any abdominal pain, nausea/vomiting, fevers/chills, pain at insertion site, or other drain concerns. She reports flushing her drain daily with 5mL of saline and approximately 1-47mL output for the past week.   Past Medical History:  Diagnosis Date   Back pain    from disc problem neck and low back   Medical history non-contributory     Past Surgical History:  Procedure Laterality Date   ANTERIOR CERVICAL DECOMP/DISCECTOMY FUSION N/A 11/08/2021   Procedure: ACDF - C4-C5 - C5-C6;  Surgeon: Louis Shove, MD;  Location: [facility] OR;  Service: Neurosurgery;  Laterality: N/A;  3C   APPENDECTOMY     CESAREAN SECTION N/A 02/02/2018   Procedure: CESAREAN SECTION;  Surgeon: Barbra Lang PARAS, DO;  Location: [facility];  Service: Obstetrics;  Laterality: N/A;   CHOLECYSTECTOMY N/A 12/23/2023   Procedure: LAPAROSCOPIC CHOLECYSTECTOMY;  Surgeon: Ned Hila, MD;  Location: [facility] ORS;  Service: General;  Laterality: N/A;  ICG    Allergies: Porcine (pork) protein-containing drug products  Medications: Prior to Admission medications  Medication Sig Start Date End Date Taking? Authorizing Provider  acetaminophen  (TYLENOL ) 500 MG tablet Take 2 tablets (1,000 mg total) by mouth every 6 (six) hours as needed for mild pain (pain score 1-3). 12/23/23 12/22/24  Augustus Almarie RAMAN, PA-C  ibuprofen  (ADVIL ) 200 MG tablet Take 200 mg by mouth every 6 (six) hours as needed for moderate pain. Pt states she takes up to 1,000mg  over the course of the day.    [provider]  methocarbamol  (ROBAXIN ) 500 MG tablet Take 1 tablet (500 mg total) by mouth 2 (two) times daily. 12/19/23   Leatrice Vernell HERO, NP  omeprazole  (PRILOSEC) 40 MG capsule Take 1 capsule (40 mg total) by mouth daily. 12/19/23   Leatrice Vernell HERO, NP  oxyCODONE  (OXY IR/ROXICODONE ) 5 MG immediate release tablet Take 1 tablet (5 mg total) by mouth every 6 (six) hours as needed for severe pain (pain score 7-10). 12/30/23   Tammy Sor, PA-C     No family history on file.  Social History   Socioeconomic History   Marital status: Married    Spouse name: Alkhder,Mohammed   Number of children: 5   Years of education: Not on file   Highest education level: Not on file  Occupational History   Not on file  Tobacco Use   Smoking  status: Never   Smokeless tobacco: Never  Vaping Use   Vaping status: Never Used  Substance and Sexual Activity   Alcohol use: No   Drug use: No   Sexual activity: Yes    Birth control/protection: None  Other Topics Concern   Not on file  Social History Narrative   Not on file   Social Drivers of Health   Tobacco Use: Low Risk (12/27/2023)   Patient History     Smoking Tobacco Use: Never    Smokeless Tobacco Use: Never    Passive Exposure: Not on file  Financial Resource Strain: Not on file  Food Insecurity: No Food Insecurity (12/27/2023)   Epic    Worried About Programme Researcher, Broadcasting/film/video in the Last Year: Never true    Ran Out of Food in the Last Year: Never true  Transportation Needs: No Transportation Needs (12/27/2023)   Epic    Lack of Transportation (Medical): No    Lack of Transportation (Non-Medical): No  Physical Activity: Not on file  Stress: Not on file  Social Connections: Not on file  Depression (EYV7-0): Not on file  Alcohol Screen: Not on file  Housing: Low Risk (12/27/2023)   Epic    Unable to Pay for Housing in the Last Year: No    Number of Times Moved in the Last Year: 0    Homeless in the Last Year: No  Utilities: Not At Risk (12/27/2023)   Epic    Threatened with loss of utilities: No  Health Literacy: Not on file    Vital Signs: BP: 113/81  HR: 94  SpO2: 98%  Temp: 98.73F  Physical Exam Vitals reviewed.  Constitutional:      Appearance: Normal appearance.  Cardiovascular:     Rate and Rhythm: Normal rate.  Pulmonary:     Effort: Pulmonary effort is normal.  Abdominal:     General: Abdomen is flat.     Palpations: Abdomen is soft.     Tenderness: There is no abdominal tenderness.     Comments: RUQ drain sutured in place with clean, dry overlying bandage. Insertion site is non-erythematous and minimally tender to palpation. Bulb to suction with ~63mL bilious drainage.   Skin:    General: Skin is warm and dry.  Neurological:     Mental Status: She is alert and oriented to person, place, and time.     Imaging: No results found.  Labs:  CBC: Recent Labs    12/23/23 1219 12/27/23 1021 12/28/23 0415 12/29/23 0503  WBC 7.0 7.6 6.2 5.0  HGB 13.6 13.8 11.2* 10.8*  HCT 41.4 41.3 33.9* 32.8*  PLT 222 313 251 266    COAGS: Recent Labs    12/28/23 0415  INR 1.1    BMP: Recent Labs     12/23/23 1219 12/27/23 1021 12/28/23 0415 12/29/23 0503  NA 136 135 134* 134*  K 3.8 3.3* 3.2* 3.7  CL 102 99 101 103  CO2 24 25 24 24   GLUCOSE 100* 120* 170* 143*  BUN 11 13 10 8   CALCIUM 9.2 9.7 8.6* 8.2*  CREATININE 0.63 0.56 0.57 0.47  GFRNONAA >60 >60 >60 >60    LIVER FUNCTION TESTS: Recent Labs    12/23/23 1219 12/27/23 1021 12/28/23 0415 12/29/23 0503  BILITOT 0.5 1.3* 0.8 0.8  AST 14* 39 27 23  ALT 7 43 27 26  ALKPHOS 67 349* 283* 284*  PROT 7.9 8.3* 6.7 6.3*  ALBUMIN 4.0 3.5 2.9* 2.7*  Assessment/Plan:  43 year old female with a history of acute cholecystitis s/p laparoscopic cholecystectomy on 12/10 with Dr. DELENA Ned complicated by post-operative biloma/biliary leak. Patient underwent 10Fr RUQ drain placement with Dr. DELENA Balder in IR on 12/15.  -CT scan today shows no evidence of residual fluid collection  -Subsequent drain injection was met with significant resistance with the majority of contrast draining around the insertion site and onto the skin. No evidence of persistent biliary leak  -Patient reporting no fevers/chills, no abdominal pain, and scant drain output for the past week -Case and imaging reviewed with Dr. VEAR Lent and the decision was made to remove the drain today  -Return precautions were discussed including, but not limited to worsening abdominal pain, nausea/vomiting, fevers/chills, or similar presentation to previous fluid collection development -Patient encouraged to follow up with Surgery and IR as needed   Signed: Glennon CHRISTELLA Bal, PA-C 01/12/2024, 2:33 PM   Please refer to Dr. Jacquelin attestation of this note for management and plan.   I spent a total of 15 Minutes in face to face in clinical consultation, greater than 50% of which was counseling/coordinating care for perihepatic fluid collection drain care.  "
